# Patient Record
Sex: Female | Born: 1975 | Race: White | Hispanic: No | Smoking: Current every day smoker
Health system: Southern US, Community
[De-identification: ages and names within clinical notes are randomized; demographics above are authoritative.]

## PROBLEM LIST (undated history)

## (undated) DIAGNOSIS — F329 Major depressive disorder, single episode, unspecified: Secondary | ICD-10-CM

## (undated) DIAGNOSIS — IMO0002 Reserved for concepts with insufficient information to code with codable children: Secondary | ICD-10-CM

## (undated) DIAGNOSIS — M1990 Unspecified osteoarthritis, unspecified site: Secondary | ICD-10-CM

## (undated) DIAGNOSIS — F32A Depression, unspecified: Secondary | ICD-10-CM

## (undated) HISTORY — DX: Unspecified osteoarthritis, unspecified site: M19.90

## (undated) HISTORY — DX: Major depressive disorder, single episode, unspecified: F32.9

## (undated) HISTORY — DX: Depression, unspecified: F32.A

## (undated) HISTORY — DX: Reserved for concepts with insufficient information to code with codable children: IMO0002

---
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
## 2000-05-31 HISTORY — PX: DILATION AND CURETTAGE OF UTERUS: SHX78

## 2004-05-31 DIAGNOSIS — IMO0002 Reserved for concepts with insufficient information to code with codable children: Secondary | ICD-10-CM

## 2004-05-31 HISTORY — DX: Reserved for concepts with insufficient information to code with codable children: IMO0002

## 2011-09-12 ENCOUNTER — Emergency Department: Payer: Self-pay | Admitting: Emergency Medicine

## 2013-11-28 ENCOUNTER — Encounter: Payer: Self-pay | Admitting: Adult Health

## 2013-11-28 ENCOUNTER — Ambulatory Visit (INDEPENDENT_AMBULATORY_CARE_PROVIDER_SITE_OTHER): Payer: Managed Care, Other (non HMO) | Admitting: Adult Health

## 2013-11-28 ENCOUNTER — Encounter (INDEPENDENT_AMBULATORY_CARE_PROVIDER_SITE_OTHER): Payer: Self-pay

## 2013-11-28 VITALS — BP 96/60 | HR 90 | Temp 98.1°F | Resp 14 | Ht 62.25 in | Wt 113.5 lb

## 2013-11-28 DIAGNOSIS — Z7189 Other specified counseling: Secondary | ICD-10-CM

## 2013-11-28 DIAGNOSIS — Z7689 Persons encountering health services in other specified circumstances: Secondary | ICD-10-CM

## 2013-11-28 DIAGNOSIS — M4120 Other idiopathic scoliosis, site unspecified: Secondary | ICD-10-CM

## 2013-11-28 DIAGNOSIS — M419 Scoliosis, unspecified: Secondary | ICD-10-CM

## 2013-11-28 MED ORDER — CYCLOBENZAPRINE HCL 5 MG PO TABS: 5.0000 mg | ORAL_TABLET | Freq: Three times a day (TID) | ORAL | Status: DC | PRN

## 2013-11-28 NOTE — Patient Instructions (Signed)
   Thank you for choosing [facility] your [facility].  Please schedule your annual physical at your convenience.  You will need to be fasting so that we can check labs.  [facility]. Watch for those sharks!

## 2013-11-28 NOTE — Progress Notes (Signed)
Pre visit review using our clinic review tool, if applicable. No additional management support is needed unless otherwise documented below in the visit note. 

## 2013-11-28 NOTE — Progress Notes (Signed)
Patient ID: Emma Hicks, female   DOB: Jul 05, 1975, 38 y.o.   MRN: [id number]   Subjective:    Patient ID: Emma RaddleMelody Hocker, female    DOB: Jul 05, 1975, 38 y.o.   MRN: [id number]  HPI  Shamaya presents to clinic to establish care. She was mainly obtaining her health care from the [facility] secondary to not having any insurance. Followed at [facility] for her PAP. Overall she is feeling well. Hx of scoliosis s/p harrington rod placement. She follows with chiropractor for maintenance.    Past Medical History  Diagnosis Date  . Arthritis   . Depression   . Ulcer 2006     Past Surgical History  Procedure Laterality Date  . Herrington rods  1992    scoilosis     Family History  Problem Relation Age of Onset  . Asthma Mother   . Heart disease Mother   . Hypertension Mother   . Mental illness Mother   . Asthma Sister   . Hypertension Sister   . Aneurysm Sister 27    died  . Asthma Sister      History   Social History  . Marital Status: Divorced    Spouse Name: N/A    Number of Children: 1  . 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 Smoking status: Former Smoker -- 1.50 packs/day for 16 years  . Smokeless tobacco: Not on file  . Alcohol Use: Yes  . Drug Use: No  . Sexual Activity: Not on file   Other Topics Concern  . Not on file   Social History Narrative   [place] She is divorced. She had a baby girl in 1999 that she gave up for adoption at age 38. She also has had a miscarriage. She is currently living in [place]. 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 She is currently working at the [employer] in [place].    Review of Systems  Constitutional: Negative.   HENT: Negative.   Eyes: Negative.   Respiratory: Negative.   Cardiovascular: Negative.   Gastrointestinal:  Negative.   Endocrine: Negative.   Genitourinary: Negative.   Musculoskeletal:       Scoliosis s/p harrington rods  Skin: Negative.   Allergic/Immunologic: Negative.   Neurological: Negative.   Hematological: Negative.   Psychiatric/Behavioral: Negative.        Objective:  BP 96/60  Pulse 90  Temp(Src) 98.1 F (36.7 C) (Oral)  Resp 14  Ht 5' 2.25" (1.581 m)  Wt 113 lb 8 oz (51.483 kg)  BMI 20.60 kg/m2  SpO2 97%  LMP 11/12/2013   Physical Exam  Constitutional: She is oriented to person, place, and time. No distress.  HENT:  Head: Normocephalic and atraumatic.  Eyes: Conjunctivae and EOM are normal.  Neck: Normal range of motion. Neck supple.  Cardiovascular: Normal rate, regular rhythm, normal heart sounds and intact distal pulses.  Exam reveals no gallop and no friction rub.   No murmur heard. Pulmonary/Chest: Effort normal and breath sounds normal. No respiratory distress. She has no wheezes. She has no rales.  Musculoskeletal: Normal range of motion.  Neurological: She is alert and oriented to person, place, and time. She has normal reflexes. Coordination normal.  Skin: Skin is warm and dry.  Psychiatric: She has a normal mood and affect. Her behavior is normal. Judgment  and thought content normal.      Assessment & Plan:   1. Scoliosis She has harrington rods. No limitations. Occasionally takes muscle relaxer which seem to help her muscle tightness and spasms. Followed by chiropractor approximately every 2 months.  2. Encounter to establish care Review medical, surgical, family, social hx. Medications reviewed. Plan for her to return for her annual physical exam. Has PAPs at [facility].

## 2014-01-15 ENCOUNTER — Telehealth: Payer: Self-pay | Admitting: *Deleted

## 2014-01-15 NOTE — Telephone Encounter (Signed)
Pt is coming in tomorrow what labs and dx?  

## 2014-01-15 NOTE — Telephone Encounter (Signed)
Can you call her - I haven't ordered any. She is supposed to come in for her physical ???

## 2014-01-16 ENCOUNTER — Other Ambulatory Visit: Payer: Managed Care, Other (non HMO)

## 2014-01-16 NOTE — Patient Instructions (Signed)
Refer to phone note

## 2014-01-16 NOTE — Telephone Encounter (Signed)
Pt came in at 8am for labs.  She was upset that we did not have any orders.  She states she took 2 hours of personal time to come in for lab draw.  She further states you never mentioned she needed a PE.

## 2014-01-16 NOTE — Telephone Encounter (Signed)
What labs was she coming in for? Many patients that establish with me have been used to getting labs before coming to see their doctor. I do things a little different. I see the pt and then order labs.

## 2014-05-28 ENCOUNTER — Encounter: Payer: Self-pay | Admitting: Nurse Practitioner

## 2014-05-28 ENCOUNTER — Ambulatory Visit (INDEPENDENT_AMBULATORY_CARE_PROVIDER_SITE_OTHER): Payer: Managed Care, Other (non HMO) | Admitting: Nurse Practitioner

## 2014-05-28 VITALS — BP 100/62 | HR 82 | Temp 98.0°F | Resp 12 | Ht 63.0 in | Wt 117.8 lb

## 2014-05-28 DIAGNOSIS — F39 Unspecified mood [affective] disorder: Secondary | ICD-10-CM

## 2014-05-28 DIAGNOSIS — G43909 Migraine, unspecified, not intractable, without status migrainosus: Secondary | ICD-10-CM | POA: Insufficient documentation

## 2014-05-28 DIAGNOSIS — G43829 Menstrual migraine, not intractable, without status migrainosus: Secondary | ICD-10-CM

## 2014-05-28 DIAGNOSIS — F4310 Post-traumatic stress disorder, unspecified: Secondary | ICD-10-CM | POA: Insufficient documentation

## 2014-05-28 DIAGNOSIS — N943 Premenstrual tension syndrome: Secondary | ICD-10-CM

## 2014-05-28 DIAGNOSIS — R4586 Emotional lability: Secondary | ICD-10-CM | POA: Insufficient documentation

## 2014-05-28 MED ORDER — TOPIRAMATE 25 MG PO TABS: 25.0000 mg | ORAL_TABLET | Freq: Every day | ORAL | Status: DC

## 2014-05-28 NOTE — Progress Notes (Signed)
Pre visit review using our clinic review tool, if applicable. No additional management support is needed unless otherwise documented below in the visit note. 

## 2014-05-28 NOTE — Patient Instructions (Addendum)
Follow up in two months.   Call us if you need us in the mean time.   Happy New Year!

## 2014-05-28 NOTE — Progress Notes (Signed)
Subjective:    Patient ID: Emma Hicks, female    DOB: [birth year], [age] y.o.   MRN: [id number]  HPI  Emma Hicks is a 38 yo female here to meet her new provider.   1) [facility]- lab work and a Physical  2) Eye twitching- right eye, intermittent, started eye lid and is not less   3) PTSD- Counseling, medications, group therapy ect... Past substance abuse. Pt has hand shaking. Mood swings, irritable, hyperactive, not sleeping well, wake up feeling tired.  Paxil- in past  OTC sleep aids: feels groggy and less hungry for breakfast  Trazodone- unsure   Patient states she has [facility]   OTC medications: Mutlivitamin, iron, vitamin D.   4)Menstrual migraines in past. Headaches not as common now, still happening. Last one a few months ago. [facility]   Review of Systems  Constitutional: Negative for fever, chills, diaphoresis and fatigue.  Cardiovascular: Negative for chest pain, palpitations and leg swelling.  Gastrointestinal: Negative for nausea, vomiting and diarrhea.  Skin: Negative for rash.  Psychiatric/Behavioral: Positive for sleep disturbance and decreased concentration. Negative for suicidal ideas, hallucinations, behavioral problems, confusion, dysphoric mood and agitation. The patient is nervous/anxious and is hyperactive.        Irritability, also   Past Medical History  Diagnosis Date  . Arthritis   . Depression   . Ulcer 2006    History   Social History  . Marital Status: Divorced    Spouse Name: N/A    Number of Children: 1  . 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 Smoking status: Former Smoker -- 1.50 packs/day for 16 years  . Smokeless tobacco: Not on file  . Alcohol Use: Yes  . Drug Use: No  . Sexual Activity: Not on file   Other Topics Concern  . Not on file   Social History Narrative   [place]ma Hicks [place] She is divorced. She had a baby girl in 1999 that she gave up for adoption at age 38. She also has had a miscarriage. [place] living in [place]. 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 She is currently working at the [employer] in [place].    Past Surgical History  Procedure Laterality Date  . Herrington rods  1992    scoilosis    Family History  Problem Relation Age of Onset  . Asthma Mother   . Heart disease Mother   . Hypertension Mother   . Mental illness Mother   . Asthma Sister   . Hypertension Sister   . Aneurysm Sister 27    died  . Asthma Sister     Allergies  Allergen Reactions  . Oxycodone Itching  . Prednisone     Current Outpatient Prescriptions on File Prior to Visit  Medication Sig Dispense Refill  . cyclobenzaprine (FLEXERIL) 5 MG tablet Take 1 tablet (5 mg total) by mouth 3 (three) times daily as needed for muscle spasms. 30 tablet 3  . IRON PO Take 1 tablet by mouth daily.    . Multiple Vitamin (MULTIVITAMIN) tablet Take 1 tablet by mouth daily.     No current facility-administered medications on file prior to visit.      Objective:   Physical Exam  Constitutional: She is oriented to person, place,  and time. She appears well-developed and well-nourished. No distress.  HENT:  Head: Normocephalic and atraumatic.  Cardiovascular: Normal rate and regular rhythm.   Pulmonary/Chest: Effort normal and breath sounds normal.  Neurological: She is alert and oriented to person, place, and time.  Skin: Skin is warm and dry. No rash noted. She is not diaphoretic.  Psychiatric: Her speech is normal and behavior is normal. Judgment and thought content normal. Her mood appears anxious. Her affect is not angry, not blunt, not labile and not inappropriate. Thought content is not paranoid and not delusional. Cognition and memory are not impaired. She does not express  impulsivity or inappropriate judgment. She does not exhibit a depressed mood. She expresses no homicidal and no suicidal ideation. She expresses no suicidal plans and no homicidal plans. She exhibits normal recent memory and normal remote memory.   BP 100/62 mmHg  Pulse 82  Temp(Src) 98 F (36.7 C) (Oral)  Resp 12  Ht 5\' 3"  (1.6 m)  Wt 117 lb 12.8 oz (53.434 kg)  BMI 20.87 kg/m2  SpO2 99%    Assessment & Plan:

## 2014-05-28 NOTE — Assessment & Plan Note (Signed)
Stable. OCP helpful, still having some occasionally. Rx for topiramate 25 mg daily.

## 2014-05-28 NOTE — Assessment & Plan Note (Signed)
Obtain records from [facility]. Pt stable. Screened for Bipolar disorder. Unable to make diagnosis at this time, but it is suspected. FU in 2/29/15

## 2014-07-29 ENCOUNTER — Encounter: Payer: Self-pay | Admitting: Nurse Practitioner

## 2014-07-29 ENCOUNTER — Ambulatory Visit (INDEPENDENT_AMBULATORY_CARE_PROVIDER_SITE_OTHER): Payer: Managed Care, Other (non HMO) | Admitting: Nurse Practitioner

## 2014-07-29 VITALS — BP 110/74 | HR 96 | Resp 14 | Ht 63.0 in | Wt 115.2 lb

## 2014-07-29 DIAGNOSIS — N943 Premenstrual tension syndrome: Secondary | ICD-10-CM

## 2014-07-29 DIAGNOSIS — G43829 Menstrual migraine, not intractable, without status migrainosus: Secondary | ICD-10-CM

## 2014-07-29 DIAGNOSIS — Z8739 Personal history of other diseases of the musculoskeletal system and connective tissue: Secondary | ICD-10-CM

## 2014-07-29 MED ORDER — CELECOXIB 200 MG PO CAPS: 200.0000 mg | ORAL_CAPSULE | Freq: Two times a day (BID) | ORAL | Status: DC

## 2014-07-29 MED ORDER — TOPIRAMATE 25 MG PO TABS: 25.0000 mg | ORAL_TABLET | Freq: Two times a day (BID) | ORAL | Status: DC

## 2014-07-29 NOTE — Patient Instructions (Addendum)
Try the Topamax 2 tablets. Take 1 if too fuzzy headed.   Try 2 of the Celebrex. FU in 1 month.

## 2014-07-29 NOTE — Progress Notes (Signed)
   Subjective:    Patient ID: Emma Hicks, female    DOB: [birth year], 39 y.o.   MRN: [id number]  HPI  Emma Hicks is a [age] female following up on migraines.   1) Not taking topamax on a daily basis, no changes, same amount of headaches  2) Pt took Celebrex in past with relief for bursitis in bilateral hips and wrist pain.    Review of Systems  Respiratory: Negative for chest tightness, shortness of breath and wheezing.   Cardiovascular: Negative for chest pain, palpitations and leg swelling.  Gastrointestinal: Negative for nausea, vomiting and diarrhea.  Neurological: Positive for headaches. Negative for dizziness, syncope, weakness and numbness.       Objective:   Physical Exam  Constitutional: She is oriented to person, place, and time. She appears well-developed and well-nourished. No distress.  BP 110/74 mmHg  Pulse 96  Resp 14  Ht 5\' 3"  (1.6 m)  Wt 115 lb 4 oz (52.277 kg)  BMI 20.42 kg/m2  SpO2 99%   HENT:  Head: Normocephalic and atraumatic.  Right Ear: External ear normal.  Left Ear: External ear normal.  Cardiovascular: Normal rate, regular rhythm, normal heart sounds and intact distal pulses.  Exam reveals no gallop and no friction rub.   No murmur heard. Pulmonary/Chest: Effort normal and breath sounds normal. No respiratory distress. She has no wheezes. She has no rales. She exhibits no tenderness.  Musculoskeletal: She exhibits tenderness.  Bilateral hips have localized tenderness laterally and with ROM  Neurological: She is alert and oriented to person, place, and time. No cranial nerve deficit. She exhibits normal muscle tone. Coordination normal.  Skin: Skin is warm and dry. No rash noted. She is not diaphoretic.  Psychiatric: She has a normal mood and affect. Her behavior is normal. Judgment and thought content normal.      Assessment & Plan:

## 2014-07-29 NOTE — Progress Notes (Signed)
Pre visit review using our clinic review tool, if applicable. No additional management support is needed unless otherwise documented below in the visit note. 

## 2014-08-03 DIAGNOSIS — Z8739 Personal history of other diseases of the musculoskeletal system and connective tissue: Secondary | ICD-10-CM | POA: Insufficient documentation

## 2014-08-03 NOTE — Assessment & Plan Note (Addendum)
Bursitis is worsening again. Celebrex 200 mg twice daily prn for pain. Instructed pt to take something like zantac OTC to protect her stomach.

## 2014-08-03 NOTE — Assessment & Plan Note (Signed)
Changed topamax to twice daily. Stressed importance of taking medication as prescribed, otherwise it will not be helpful. She then discussed taking her friend's Soma and asked about this. I told her our policy for prescribing controlled substances and she was no longer interested. Will follow.

## 2014-08-27 ENCOUNTER — Ambulatory Visit: Payer: Managed Care, Other (non HMO) | Admitting: Nurse Practitioner

## 2014-09-09 ENCOUNTER — Ambulatory Visit: Payer: Managed Care, Other (non HMO) | Admitting: Nurse Practitioner

## 2014-11-03 ENCOUNTER — Other Ambulatory Visit: Payer: Self-pay | Admitting: Nurse Practitioner

## 2014-11-04 NOTE — Telephone Encounter (Signed)
Last visit 07/29/14, refill?

## 2014-12-03 ENCOUNTER — Ambulatory Visit: Payer: Managed Care, Other (non HMO) | Admitting: Nurse Practitioner

## 2014-12-05 ENCOUNTER — Ambulatory Visit (INDEPENDENT_AMBULATORY_CARE_PROVIDER_SITE_OTHER): Payer: Managed Care, Other (non HMO) | Admitting: Nurse Practitioner

## 2014-12-05 VITALS — BP 102/68 | HR 91 | Temp 98.1°F | Resp 16 | Ht 63.0 in | Wt 114.8 lb

## 2014-12-05 DIAGNOSIS — R21 Rash and other nonspecific skin eruption: Secondary | ICD-10-CM

## 2014-12-05 MED ORDER — TRIAMCINOLONE ACETONIDE 0.025 % EX OINT: 1.0000 "application " | TOPICAL_OINTMENT | Freq: Two times a day (BID) | CUTANEOUS | Status: DC

## 2014-12-05 MED ORDER — RANITIDINE HCL 150 MG PO TABS: 150.0000 mg | ORAL_TABLET | Freq: Two times a day (BID) | ORAL | Status: DC

## 2014-12-05 MED ORDER — CETIRIZINE HCL 10 MG PO TABS: 10.0000 mg | ORAL_TABLET | Freq: Every day | ORAL | Status: DC

## 2014-12-05 MED ORDER — CELECOXIB 200 MG PO CAPS: 200.0000 mg | ORAL_CAPSULE | Freq: Two times a day (BID) | ORAL | Status: DC

## 2014-12-05 NOTE — Patient Instructions (Signed)

## 2014-12-05 NOTE — Progress Notes (Signed)
   Subjective:    Patient ID: Emma Hicks, female    DOB: [birth year], 39 y.o.   MRN: [id number]  HPI  Emma Hicks is a 39 yo female with a CC of rash.   1) Neck to shoulders and abdomen  Fastmed- prednisone, ranitidine, and certrizine  Benadryl- not helpful  Small ticks recently seen on pt- no attachment or engorgement seen by pt.   Changed her body wash and detergent recently, but switched back recently. Last day prednisone broke out again.   Review of Systems  Constitutional: Negative for fever, chills, diaphoresis and fatigue.  Respiratory: Negative for chest tightness, shortness of breath and wheezing.   Cardiovascular: Negative for chest pain, palpitations and leg swelling.  Gastrointestinal: Negative for nausea and vomiting.  Skin: Positive for rash.  Neurological: Negative for dizziness, weakness, numbness and headaches.  Psychiatric/Behavioral: The patient is not nervous/anxious.       Objective:   Physical Exam  Constitutional: She is oriented to person, place, and time. She appears well-developed and well-nourished. No distress.  BP 102/68 mmHg  Pulse 91  Temp(Src) 98.1 F (36.7 C)  Resp 16  Ht 5\' 3"  (1.6 m)  Wt 114 lb 12.8 oz (52.073 kg)  BMI 20.34 kg/m2  SpO2 98%   HENT:  Head: Normocephalic and atraumatic.  Right Ear: External ear normal.  Left Ear: External ear normal.  Neurological: She is alert and oriented to person, place, and time. No cranial nerve deficit. She exhibits normal muscle tone. Coordination normal.  Skin: Skin is warm and dry. No rash noted. She is not diaphoretic.  Patches of red, dry skin, no significant findings  Psychiatric: She has a normal mood and affect. Her behavior is normal. Judgment and thought content normal.      Assessment & Plan:

## 2014-12-18 ENCOUNTER — Encounter: Payer: Self-pay | Admitting: Nurse Practitioner

## 2014-12-18 DIAGNOSIS — R21 Rash and other nonspecific skin eruption: Secondary | ICD-10-CM | POA: Insufficient documentation

## 2014-12-18 NOTE — Assessment & Plan Note (Signed)
Will try kenalog ointment since other treatments (cetrizine, ranitidine, prednisone) were not helpful. Will send to dermatology if not helpful. FU prn worsening/failure to improve.

## 2015-02-18 ENCOUNTER — Other Ambulatory Visit: Payer: Self-pay | Admitting: *Deleted

## 2015-02-18 MED ORDER — CELECOXIB 200 MG PO CAPS: 200.0000 mg | ORAL_CAPSULE | Freq: Two times a day (BID) | ORAL | Status: DC

## 2015-03-14 ENCOUNTER — Other Ambulatory Visit: Payer: Self-pay | Admitting: Nurse Practitioner

## 2015-03-14 NOTE — Telephone Encounter (Signed)
Patient coming for appointment on 03/20/15

## 2015-03-14 NOTE — Telephone Encounter (Signed)
Needs to be seen for medication change.

## 2015-03-14 NOTE — Telephone Encounter (Signed)
Please advise in Carrie's Absence today?

## 2015-03-14 NOTE — Telephone Encounter (Signed)
Pt called about the medication that she's taking for her arithitis and burcitis it's not working. Medication is celecoxib (CELEBREX) 200 MG capsule. Pt would like to know what else can be prescribed. Pt heard of a medication called Relafen. Pharmacy is [facility] on [address]. Thank You!

## 2015-03-14 NOTE — Telephone Encounter (Signed)
Asher MuirJamie, can you call this patient and schedule an appointment with C.Doss

## 2015-03-17 NOTE — Telephone Encounter (Signed)
Thanks

## 2015-03-20 ENCOUNTER — Ambulatory Visit: Payer: Managed Care, Other (non HMO) | Admitting: Nurse Practitioner

## 2015-04-02 ENCOUNTER — Other Ambulatory Visit: Payer: Self-pay | Admitting: Orthopedic Surgery

## 2015-04-02 DIAGNOSIS — M24852 Other specific joint derangements of left hip, not elsewhere classified: Secondary | ICD-10-CM

## 2015-04-17 ENCOUNTER — Ambulatory Visit: Payer: Managed Care, Other (non HMO)

## 2015-05-05 ENCOUNTER — Ambulatory Visit
Admission: RE | Admit: 2015-05-05 | Discharge: 2015-05-05 | Disposition: A | Discharge status: Home / Self Care | Payer: Managed Care, Other (non HMO) | Source: Ambulatory Visit | Attending: Orthopedic Surgery | Admitting: Orthopedic Surgery

## 2015-05-05 ENCOUNTER — Ambulatory Visit: Payer: Managed Care, Other (non HMO)

## 2015-05-05 DIAGNOSIS — M25552 Pain in left hip: Secondary | ICD-10-CM | POA: Diagnosis not present

## 2015-05-05 DIAGNOSIS — M419 Scoliosis, unspecified: Secondary | ICD-10-CM | POA: Insufficient documentation

## 2015-05-05 DIAGNOSIS — M24852 Other specific joint derangements of left hip, not elsewhere classified: Secondary | ICD-10-CM

## 2015-05-05 DIAGNOSIS — M25452 Effusion, left hip: Secondary | ICD-10-CM | POA: Insufficient documentation

## 2015-05-05 MED ORDER — GADOBENATE DIMEGLUMINE 529 MG/ML IV SOLN: 0.0500 mL | Freq: Once | INTRAVENOUS | Status: DC | PRN

## 2015-05-05 MED ORDER — IOHEXOL 180 MG/ML  SOLN: 15.0000 mL | Freq: Once | INTRAMUSCULAR | Status: DC | PRN

## 2016-03-17 ENCOUNTER — Ambulatory Visit: Payer: Worker's Compensation | Attending: Family Medicine | Admitting: Occupational Therapy

## 2016-03-17 DIAGNOSIS — M79601 Pain in right arm: Secondary | ICD-10-CM | POA: Diagnosis present

## 2016-03-17 DIAGNOSIS — M6281 Muscle weakness (generalized): Secondary | ICD-10-CM | POA: Diagnosis present

## 2016-03-17 DIAGNOSIS — R208 Other disturbances of skin sensation: Secondary | ICD-10-CM | POA: Diagnosis present

## 2016-03-17 NOTE — Therapy (Signed)
[facility] [address] [place], [address] Phone: [phone]   Fax:  [phone]  Occupational Therapy Evaluation  Patient Details  Name: Emma Hicks MRN: [id number] Date of Birth: 03/18/1976 Referring Provider: Harrie Jeans  Encounter Date: 03/17/2016      OT End of Session - 03/17/16 2032    Visit Number 1   Number of Visits 4   Date for OT Re-Evaluation 04/14/16   OT Start Time 1338   OT Stop Time 1430   OT Time Calculation (min) 52 min   Activity Tolerance Patient tolerated treatment well   Behavior During Therapy [facility] for tasks assessed/performed      Past Medical History:  Diagnosis Date  . Arthritis   . Depression   . Ulcer 2006    Past Surgical History:  Procedure Laterality Date  . 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      Subjective Assessment - 03/17/16 1947    Subjective  I was hurt at [facility] where  I was working on Nov - seen PA Dedra Skeens - for lateral epicondylitis and CTS - had therapy for few wks but could  not afford it , because was not workers comp  - now was seen by Dr Harrie Jeans and is now workers comp - he refer me to theapy again    Patient Stated Goals Want to get the numbness and pain better at my R elbow and hand - don't want to drop things, pick up or hold things , go back to work , help with taking care of animal   Currently in Pain? Yes   Pain Score 4    Pain Location Arm   Pain Orientation Right   Pain Descriptors / Indicators Aching;Numbness;Pins and needles   Pain Type Chronic pain           [facility] OT Assessment - 03/17/16 0001      Assessment   Diagnosis R wrist tendinitis    Referring Provider Neidecker   Onset Date 04/01/15   Prior Therapy --  Had PT from 33/3/16 to 06/05/15     Home  Environment   Lives With Friend(s)     Prior Function   Leisure R hand dominant - looking for new job, likes to  read, movies, walk, workout, play some on phone or laptop, take care of animal     AROM   Overall AROM Comments R and L hand AROM for  digits and wrist , elbow WNL  but pain at  end range if pt force it and tight fist      Strength   Overall Strength Comments Strength in wrist in all planes 5/5  , no pain    Right Hand Grip (lbs) 42   Right Hand Lateral Pinch 15 lbs   Right Hand 3 Point Pinch 15 lbs   Left Hand Grip (lbs) 54   Left Hand Lateral Pinch 11 lbs   Left Hand 3 Point Pinch 11 lbs     HEP review with pt   R wrist - wrist splint for night time Med N glides 5 reps Tendon glides 10 reps Contrast prior   Avoid tight grip in combination with wrist flexion  And lat grip with wrist flexion   For elbow heat , massage and extended arm with PROM flexion stretch with wrist In neutral and in pronation  2 x  day                      OT Education - 03/17/16 2031    Education provided Yes   Education Details HEP and findings    Person(s) Educated Patient   Methods Explanation;Demonstration;Tactile cues;Verbal cues;Handout   Comprehension Verbal cues required;Returned demonstration;Verbalized understanding          OT Short Term Goals - 03/17/16 2044      OT SHORT TERM GOAL #1   Title Pain and numbness in R hand improve with at least 20 points on PRWHE    Baseline At eval score 36/50   Time 3   Period Weeks   Status New           OT Long Term Goals - 03/17/16 2047      OT LONG TERM GOAL #1   Title Grip strength in R hand improve with 10 lbs to not drop objects , and do hair, carry dog food   Baseline grip R 42; L 54 lbs    Time 4   Period Weeks   Status New     OT LONG TERM GOAL #2   Title Pt to be in  HEP to decrease pain at elbow and numbness/pain in R hand/wrist    Baseline very little knowledge    Time 4   Period Weeks   Status New               Plan - 03/17/16 2034    Clinical Impression Statement Pt present at eval with  diagnosis of R wrist tendinitis - pt has history of DeQuervains and still positive Finkelstein - but lateral and 3 point grip WNL - AROM in all joints on R elbow to hand WNL - and strength in wrist  in all planes 5/5 with no pain - pt  has pain in end range wrist  AROM ,digits flexion and extention  when force - pt  is one that is "hard handed" - pt to light up on ROM and grasping object - pt negative for Tinel at cubital tunnel,  but some  tenderness -  negative Tinel  and Phalens for CTS - but report numbness in R hand more than L - whole  R hand - pt do have some tenderness  and pain at elbow and lateral epicondyle - tightness with stretch of wrist extensors - pt do have history of  tightenss in upper back , shoulder protraction with scapula winging , this date pain and limitied ROM  in cervical in all planes - pt was provided with HEP for CTS and lateral epicondylitis - pt to return in week    Rehab Potential Fair   Clinical Impairments Affecting Rehab Potential ? cervical involvement ; recommend Nerve conduction test    OT Frequency 1x / week   OT Duration 4 weeks   OT Treatment/Interventions Splinting;Patient/family education;Therapeutic exercises;Contrast Bath;Passive range of motion;Manual Therapy   Plan appt with MD ? , and progress with HEP   OT Home Exercise Plan see pt instruction   Consulted and Agree with Plan of Care Patient      Patient will benefit from skilled therapeutic intervention in order to improve the following deficits and impairments:  Impaired flexibility, Impaired sensation, Pain, Impaired UE functional use  Visit Diagnosis: Other disturbances of skin sensation - Plan: Ot plan of care cert/re-cert  Pain in right arm - Plan: Ot plan of care cert/re-cert  Muscle  weakness (generalized) - Plan: Ot plan of care cert/re-cert    Problem List Patient Active Problem List   Diagnosis Date Noted  . Rash and nonspecific skin eruption 12/18/2014  . History of bursitis  08/03/2014  . Post traumatic stress disorder (PTSD) 05/28/2014  . Mood swings (HCC) 05/28/2014  . Migraines 05/28/2014    Oletta Cohn OTR/L,CLT 03/17/2016, 8:54 PM  [facility] [address] [place], [address] Phone: [phone]   Fax:  [phone]  Name: Emma Hicks MRN: [id number] Date of Birth: 20-Dec-1975

## 2016-03-17 NOTE — Patient Instructions (Signed)
R wrist - wrist splint for night time Med N glides 5 reps Tendon glides 10 reps Contrast prior   Avoid tight grip in combination with wrist flexion  And lat grip with wrist flexion   For elbow heat , massage and extended arm with PROM flexion stretch with wrist In neutral and in pronation  2 x day

## 2016-03-26 ENCOUNTER — Ambulatory Visit: Payer: Worker's Compensation | Attending: Family Medicine | Admitting: Occupational Therapy

## 2016-03-26 DIAGNOSIS — M6281 Muscle weakness (generalized): Secondary | ICD-10-CM | POA: Diagnosis present

## 2016-03-26 DIAGNOSIS — R208 Other disturbances of skin sensation: Secondary | ICD-10-CM | POA: Diagnosis present

## 2016-03-26 DIAGNOSIS — M79601 Pain in right arm: Secondary | ICD-10-CM | POA: Diagnosis present

## 2016-03-26 NOTE — Therapy (Signed)
[facility] PHYSICAL AND SPORTS MEDICINE [address] [place], [place], [address] Phone: [phone]   Fax:  [phone]  Occupational Therapy Treatment  Patient Details  Name: Emma Hicks MRN: [id number] Date of Birth: [birth year] Referring Provider: Harrie JeansNeidecker  Encounter Date: 03/26/2016      OT End of Session - 03/26/16 1031    Visit Number 2   Number of Visits 4   Date for OT Re-Evaluation 04/14/16   OT Start Time 0938   OT Stop Time 1016   OT Time Calculation (min) 38 min   Activity Tolerance Patient tolerated treatment well      Past Medical History:  Diagnosis Date  . Arthritis   . Depression   . Ulcer 2006    Past Surgical History:  Procedure Laterality Date  . 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      Subjective Assessment - 03/26/16 1025    Subjective  Doing okay - I did not get time to do all the exercises - and maybe wore the splint about 25% of time - I got new job at [employer] - cannot wear splint - but elbow still hurting and  tight - my hand also hurting    Patient Stated Goals Want to get the numbness and pain better at my R elbow and hand - don't want to drop things, pick up or hold things , go back to work , help with taking care of animal   Pain Score 6    Pain Location Elbow   Pain Orientation Right   Pain Descriptors / Indicators Aching      After paraffin and heat  Soft tissue mobs done to volar elbow - proximal and distal - as well as volar wrist and palm - over CT  Pt with some tightness at lateral epicondyle and volar aspect of arm - responded good on  graston tools 2 more than 4 - using sweeping and scooping  with follow PROM composite extention of wrist and digits  And  Extended elbow with wrist flexion - open hand  5 reps each   HEP review with pt again   R wrist - wrist splint for night time Med N glides 5 reps Tendon glides 10 reps Needed mod  A and mod v/c for doing correctly and not forcing it   Ed on Avoid tight grip in combination with wrist flexion  And lat grip with wrist flexion  And then for elbow to cont with heat - massage - and extended arm - with wrist flexion stretch - open hand - in pronation and neutral                     OT Treatments/Exercises (OP) - 03/26/16 0001      Moist Heat Therapy   Number Minutes Moist Heat 10 Minutes   Moist Heat Location Elbow     RUE Paraffin   Number Minutes Paraffin 10 Minutes   RUE Paraffin Location Hand   [facility] and manual therapy to elbow - to decrease pain                 OT Education - 03/26/16 1030    Education provided Yes   Education Details HEP review   Person(s) Educated Patient   Methods Explanation;Demonstration;Tactile cues          OT Short Term Goals -  03/17/16 2044      OT SHORT TERM GOAL #1   Title Pain and numbness in R hand improve with at least 20 points on PRWHE    Baseline At eval score 36/50   Time 3   Period Weeks   Status New           OT Long Term Goals - 03/17/16 2047      OT LONG TERM GOAL #1   Title Grip strength in R hand improve with 10 lbs to not drop objects , and do hair, carry dog food   Baseline grip R 42; L 54 lbs    Time 4   Period Weeks   Status New     OT LONG TERM GOAL #2   Title Pt to be in  HEP to decrease pain at elbow and numbness/pain in R hand/wrist    Baseline very little knowledge    Time 4   Period Weeks   Status New               Plan - 03/26/16 1031    Clinical Impression Statement Pt present with some tightness on volar elbow, forearm and CT - pt responded good on graston tools and stretches - turning red over CT - and tight - reviewed again with pt HEP and what act to avoid - pt to cont with HEP for week    Rehab Potential Fair   Clinical Impairments Affecting Rehab Potential ? cervical involvement    OT Frequency 1x / week   OT Duration  4 weeks   OT Treatment/Interventions Splinting;Patient/family education;Therapeutic exercises;Contrast Bath;Passive range of motion;Manual Therapy   Plan assess progress with HEP   OT Home Exercise Plan see pt instruction   Consulted and Agree with Plan of Care Patient      Patient will benefit from skilled therapeutic intervention in order to improve the following deficits and impairments:  Impaired flexibility, Impaired sensation, Pain, Impaired UE functional use  Visit Diagnosis: Other disturbances of skin sensation  Pain in right arm  Muscle weakness (generalized)    Problem List Patient Active Problem List   Diagnosis Date Noted  . Rash and nonspecific skin eruption 12/18/2014  . History of bursitis 08/03/2014  . Post traumatic stress disorder (PTSD) 05/28/2014  . Mood swings (HCC) 05/28/2014  . Migraines 05/28/2014    Oletta Cohn OTR/L,CLT 03/26/2016, 10:47 AM  [place] [facility] PHYSICAL AND SPORTS MEDICINE [address], [place], [address] Phone: [phone]   Fax:  [phone]  Name: Emma Hicks MRN: [id number] Date of Birth: [birth year]

## 2016-03-26 NOTE — Patient Instructions (Addendum)
  HEP review with pt again   R wrist - wrist splint for night time Med N glides 5 reps Tendon glides 10 reps Needed mod A and mod v/c for doing correctly and not forcing it   Ed on Avoid tight grip in combination with wrist flexion  And lat grip with wrist flexion  And then for elbow to cont with heat - massage - and extended arm - with wrist flexion stretch - open hand - in pronation and neutral

## 2016-04-02 ENCOUNTER — Ambulatory Visit: Payer: Worker's Compensation | Admitting: Occupational Therapy

## 2016-04-07 ENCOUNTER — Ambulatory Visit: Payer: Worker's Compensation | Attending: Family Medicine | Admitting: Occupational Therapy

## 2016-04-07 DIAGNOSIS — M6281 Muscle weakness (generalized): Secondary | ICD-10-CM | POA: Insufficient documentation

## 2016-04-07 DIAGNOSIS — R208 Other disturbances of skin sensation: Secondary | ICD-10-CM | POA: Insufficient documentation

## 2016-04-07 DIAGNOSIS — M79601 Pain in right arm: Secondary | ICD-10-CM | POA: Insufficient documentation

## 2016-04-07 NOTE — Patient Instructions (Addendum)
Extended elbow with wrist flexion - open hand  5 reps each  R wrist - wrist splint for night time Med N glides 5 reps Tendon glides 10 reps Can do some massage in webspace of thumb and over forearm   Ed on Avoid tight grip in combination with wrist flexion  And lat grip with wrist flexion  And then for elbow to cont with heat - massage - and extended arm - with wrist flexion stretch - open hand - in pronation and neutral

## 2016-04-07 NOTE — Therapy (Signed)
Rash and nonspecific skin eruption 12/18/2014  .  History of bursitis 08/03/2014  . Post traumatic stress disorder (PTSD) 05/28/2014  . Mood swings (HCC) 05/28/2014  . Migraines 05/28/2014    Oletta CohnuPreez, Wesson Stith OTR/L,CLT 04/07/2016, 2:52 PM   [facility] PHYSICAL [facility]TS MEDICINE [address] , [place], [address] Phone: [phone]   Fax:  [phone]  Name: Presley RaddleMelody Arkin MRN: [id number] Date of Birth: 16-May-1976  [place] PHYS[facility] SPORTS MEDICINE [address] [place], [place], [address] Phone: [phone]   Fax:  [phone]  Occupational Therapy Treatment  Patient Details  Name: Presley RaddleMelody Livermore MRN: [id number] Date of Birth: November 10, 1975 Referring Provider: Harrie JeansNeidecker  Encounter Date: 04/07/2016      OT End of Session - 04/07/16 0827    Visit Number 3   Number of Visits 4   Date for OT Re-Evaluation 04/14/16   OT Start Time 0807   OT Stop Time 0846   OT Time Calculation (min) 39 min   Activity Tolerance Patient tolerated treatment well   Behavior During Therapy [facility] for tasks assessed/performed      Past Medical History:  Diagnosis Date  . Arthritis   . Depression   . Ulcer 2006    Past Surgical History:  Procedure Laterality Date  . 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      Subjective Assessment - 04/07/16 0824    Subjective  My fingers and hand just stiff and ache - the more I use it - and then have to shake it because it cramp - numbness more in am - trying to sleep with my elbow straight - elbow feels better than the hand    Patient Stated Goals Want to get the numbness and pain better at my R elbow and hand - don't want to drop things, pick up or hold things , go back to work , help with taking care of animal   Currently in Pain? Yes   Pain Score 7    Pain Location Hand   Pain Orientation Right   Pain Descriptors / Indicators Aching;Cramping            [facility] OT Assessment - 04/07/16 0001      Strength   Right Hand Grip (lbs) 24   Right Hand Lateral Pinch 8 lbs   Right Hand 3 Point Pinch 12 lbs   Left Hand Grip (lbs) 25   Left Hand Lateral Pinch 10 lbs   Left Hand 3 Point Pinch 10 lbs      Assess pain - and ROM - extention 3rd digit and wrist extention  With resistance no pain Forearm extensor stretches - felt more at wrist and over extensor mass Positive test for  Phalens test Tenderness over CT Neg for Cubital tunnel and medial epicondyle  Pt report more pain this date over hand and with making fist - with some sensory changes  After paraffin and heat  Soft tissue mobs done to volar forearm - proximal and distal - as well as volar wrist and palm - over CT  Pt with some tightness at lateral epicondyle and volar aspect of fore arm - responded good on  graston tools 2 more than 4 - using sweeping and scooping  with follow PROM composite extention of wrist and digits  Also done soft tissue work and trigger point in webspace of thumb - pain decrease afterwards   Grip and prehension were decrease this date compare to eval - and increase 10 lbs on R after paraffin Reviewed HEP again with pt   Extended elbow with wrist flexion - open hand  5 reps each  R wrist - wrist splint for night time Med N glides 5 reps Tendon glides 10 reps Can do some massage in webspace of thumb and over forearm   Ed on Avoid tight grip  [place] [facility] PHYSICAL [facility] SP[facility]TS MEDICINE [address] [place], [place], [address] Phone: [phone]   Fax:  [phone]  Occupational Therapy Treatment  Patient Details  Name: Presley RaddleMelody Livermore MRN: [id number] Date of Birth: November 10, 1975 Referring Provider: Harrie JeansNeidecker  Encounter Date: 04/07/2016      OT End of Session - 04/07/16 0827    Visit Number 3   Number of Visits 4   Date for OT Re-Evaluation 04/14/16   OT Start Time 0807   OT Stop Time 0846   OT Time Calculation (min) 39 min   Activity Tolerance Patient tolerated treatment well   Behavior During Therapy [facility] for tasks assessed/performed      Past Medical History:  Diagnosis Date  . Arthritis   . Depression   . Ulcer 2006    Past Surgical History:  Procedure Laterality Date  . 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      Subjective Assessment - 04/07/16 0824    Subjective  My fingers and hand just stiff and ache - the more I use it - and then have to shake it because it cramp - numbness more in am - trying to sleep with my elbow straight - elbow feels better than the hand    Patient Stated Goals Want to get the numbness and pain better at my R elbow and hand - don't want to drop things, pick up or hold things , go back to work , help with taking care of animal   Currently in Pain? Yes   Pain Score 7    Pain Location Hand   Pain Orientation Right   Pain Descriptors / Indicators Aching;Cramping            [facility] OT Assessment - 04/07/16 0001      Strength   Right Hand Grip (lbs) 24   Right Hand Lateral Pinch 8 lbs   Right Hand 3 Point Pinch 12 lbs   Left Hand Grip (lbs) 25   Left Hand Lateral Pinch 10 lbs   Left Hand 3 Point Pinch 10 lbs      Assess pain - and ROM - extention 3rd digit and wrist extention  With resistance no pain Forearm extensor stretches - felt more at wrist and over extensor mass Positive test for  Phalens test Tenderness over CT Neg for Cubital tunnel and medial epicondyle  Pt report more pain this date over hand and with making fist - with some sensory changes  After paraffin and heat  Soft tissue mobs done to volar forearm - proximal and distal - as well as volar wrist and palm - over CT  Pt with some tightness at lateral epicondyle and volar aspect of fore arm - responded good on  graston tools 2 more than 4 - using sweeping and scooping  with follow PROM composite extention of wrist and digits  Also done soft tissue work and trigger point in webspace of thumb - pain decrease afterwards   Grip and prehension were decrease this date compare to eval - and increase 10 lbs on R after paraffin Reviewed HEP again with pt   Extended elbow with wrist flexion - open hand  5 reps each  R wrist - wrist splint for night time Med N glides 5 reps Tendon glides 10 reps Can do some massage in webspace of thumb and over forearm   Ed on Avoid tight grip

## 2016-04-14 ENCOUNTER — Ambulatory Visit: Payer: Worker's Compensation | Admitting: Occupational Therapy

## 2016-04-16 ENCOUNTER — Ambulatory Visit: Payer: Worker's Compensation | Admitting: Occupational Therapy

## 2016-04-20 ENCOUNTER — Ambulatory Visit: Payer: Worker's Compensation | Admitting: Occupational Therapy

## 2016-05-04 ENCOUNTER — Ambulatory Visit: Payer: Worker's Compensation | Attending: Family Medicine | Admitting: Occupational Therapy

## 2016-05-04 DIAGNOSIS — M6281 Muscle weakness (generalized): Secondary | ICD-10-CM | POA: Insufficient documentation

## 2016-05-04 DIAGNOSIS — M79601 Pain in right arm: Secondary | ICD-10-CM | POA: Insufficient documentation

## 2016-05-04 DIAGNOSIS — R208 Other disturbances of skin sensation: Secondary | ICD-10-CM | POA: Insufficient documentation

## 2016-05-12 ENCOUNTER — Ambulatory Visit: Payer: Worker's Compensation | Admitting: Occupational Therapy

## 2016-05-12 DIAGNOSIS — M79601 Pain in right arm: Secondary | ICD-10-CM | POA: Diagnosis present

## 2016-05-12 DIAGNOSIS — R208 Other disturbances of skin sensation: Secondary | ICD-10-CM | POA: Diagnosis not present

## 2016-05-12 DIAGNOSIS — M6281 Muscle weakness (generalized): Secondary | ICD-10-CM | POA: Diagnosis present

## 2016-05-12 NOTE — Therapy (Signed)
[facility] SPORTS MEDICINE [address] [place], [address] Phone: [phone]   Fax:  [phone]  Occupational Therapy Treatment  Patient Details  Name: Emma Hicks MRN: [id number] Date of Birth: Apr 30, 1976 Referring Provider: Harrie Jeans  Encounter Date: 05/12/2016      OT End of Session - 05/12/16 1247    Visit Number 4   Number of Visits 8   Date for OT Re-Evaluation 06/09/16   OT Start Time 0825   OT Stop Time 0903   OT Time Calculation (min) 38 min   Activity Tolerance Patient tolerated treatment well   Behavior During Therapy [facility] for tasks assessed/performed      Past Medical History:  Diagnosis Date  . Arthritis   . Depression   . Ulcer 2006    Past Surgical History:  Procedure Laterality Date  . 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      Subjective Assessment - 05/12/16 0827    Subjective  Seen the MD - and going to see again after nerve conduction - and that was done yesterday - they said normal- Working 5 days a week - still hurting - forearm hurts and elbow - hand goes still numb about once day - and hand hurts    Patient Stated Goals Want to get the numbness and pain better at my R elbow and hand - don't want to drop things, pick up or hold things , go back to work , help with taking care of animal   Currently in Pain? Yes   Pain Score 7    Pain Location Elbow   Pain Descriptors / Indicators Aching            [facility] OT Assessment - 05/12/16 0001      Strength   Right Hand Grip (lbs) 45   Right Hand Lateral Pinch 12 lbs   Right Hand 3 Point Pinch 10 lbs   Left Hand Grip (lbs) 39   Left Hand Lateral Pinch 10 lbs   Left Hand 3 Point Pinch 8 lbs                  OT Treatments/Exercises (OP) - 05/12/16 0001      RUE Paraffin   Number Minutes Paraffin 10 Minutes   RUE Paraffin Location Hand   Comments AT SOC to decreaes pain  and tightness in palm and wrist - heaintpad over hand and forearm to decrease pain       measured grip and prehension - increase compare to about month ago- see flow sheet Neg for lateral epincondyle pain - or Cubital tunnel - had some pain over medial epicondyle   After paraffin and heat  Soft tissue mobs done to volar forearm to hand and over CT  Pt with some tightness at lateral epicondyle and volar aspect of forearm/wrist  - responded good on  graston tools 2 more than 4 - using sweeping and scooping  with followed by PROM composite extention of wrist and digits  With more ease and less pain   Wrist  extention with elbow extended  5 reps each in neutral and supination position - pt to do at home   Pt lost HEP reviewed again with pt   R wrist - wrist splint for night time Med N glides 5 reps Tendon glides 10 reps And to do ulnar N glide 5 reps Needed mod  A and mod v/c for doing correctly and not forcing it   Ed on to cont avoiding tight grip in combination with wrist flexion  And lat grip with wrist flexion  And then for forearm to cont with heat - massage - and extended arm - with wrist extention stretch - open hand - in supination and neutral              OT Education - 05/12/16 1247    Education provided Yes   Education Details HEP reviewed again   [employer]) Educated Patient   Methods Explanation;Demonstration;Tactile cues;Verbal cues;Handout   Comprehension Verbal cues required;Returned demonstration;Verbalized understanding          OT Short Term Goals - 05/12/16 1249      OT SHORT TERM GOAL #1   Title Pain and numbness in R hand improve with at least 20 points on PRWHE    Baseline At eval score 36/50 - report numbness one time at least a day    Time 3   Period Weeks   Status On-going           OT Long Term Goals - 05/12/16 1250      OT LONG TERM GOAL #1   Title Grip strength in R hand improve with 10 lbs to not drop objects , and do hair,  carry dog food   Baseline see flowsheet   Time 4   Period Weeks   Status On-going     OT LONG TERM GOAL #2   Title Pt to be in  HEP to decrease pain at elbow and numbness/pain in R hand/wrist    Baseline forgot HEP - and lost paper   Time 4   Period Weeks   Status On-going               Plan - 05/12/16 1248    Clinical Impression Statement Pt cont to have some tighteness and pain over volar forearm and hand over CT - grip and prehension strength improve compare to last time - but pain still in hand and then elbow - fluctuate - pt is working  more hours at [employer] now - pt had nerve conduction yesterday - await appt with MD    Rehab Potential Fair   OT Frequency 1x / week   OT Duration 4 weeks   OT Treatment/Interventions Splinting;Patient/family education;Therapeutic exercises;Contrast Bath;Passive range of motion;Manual Therapy;Parrafin;Fluidtherapy   Plan assess progress and if appt with MD yet   OT Home Exercise Plan see pt instruction   Consulted and Agree with Plan of Care Patient      Patient will benefit from skilled therapeutic intervention in order to improve the following deficits and impairments:  Impaired flexibility, Impaired sensation, Pain, Impaired UE functional use  Visit Diagnosis: Other disturbances of skin sensation  Pain in right arm  Muscle weakness (generalized)    Problem List Patient Active Problem List   Diagnosis Date Noted  . Rash and nonspecific skin eruption 12/18/2014  . History of bursitis 08/03/2014  . Post traumatic stress disorder (PTSD) 05/28/2014  . Mood swings (HCC) 05/28/2014  . Migraines 05/28/2014    Oletta Cohn OTR/L,CLT 05/12/2016, 12:52 PM  [place] [facility] PHYSICAL AND SPORTS MEDICINE [address] [place], [address] Phone: [phone]   Fax:  [phone]  Name: Ericca Zuhlke MRN: [id number] Date of Birth: [birth year]

## 2016-05-12 NOTE — Patient Instructions (Addendum)
Wrist  extention with elbow extended  5 reps each in neutral and supination position - pt to do at home   Pt lost HEP reviewed again with pt   R wrist - wrist splint for night time Med N glides 5 reps Tendon glides 10 reps And to do ulnar N glide 5 reps  Ed on to cont avoiding tight grip in combination with wrist flexion  And lat grip with wrist flexion  And then for forearm to cont with heat - massage - and extended arm - with wrist extention stretch - open hand - in supination and neutral

## 2016-05-20 ENCOUNTER — Ambulatory Visit: Payer: Worker's Compensation | Admitting: Occupational Therapy

## 2016-05-20 DIAGNOSIS — M6281 Muscle weakness (generalized): Secondary | ICD-10-CM

## 2016-05-20 DIAGNOSIS — R208 Other disturbances of skin sensation: Principal | ICD-10-CM

## 2016-05-20 DIAGNOSIS — M79601 Pain in right arm: Secondary | ICD-10-CM

## 2016-05-20 NOTE — Patient Instructions (Addendum)
  Wrist  extention with elbow extended to the side - composite Med N glides   1 lbs weight - for RD, UD , wrist extention<> neutral ; sup/pro  10 reps each - supported  To do only 1 x day - and 1 set  Ed on to cont avoiding tight grip in combination with wrist flexion  And lat grip with wrist flexion  And then for forearm to cont with heat - massage - composite Med N glide  And 1 lbs HEP for wrist with forearm supported

## 2016-05-20 NOTE — Therapy (Signed)
[facility] [address] [place], [place], [address] Phone: [phone]   Fax:  [phone]  Occupational Therapy Treatment  Patient Details  Name: Emma RaddleMelody Rehfeld MRN: [id number] Date of Birth: 02-27-1976 Referring Provider: Harrie JeansNeidecker  Encounter Date: 05/20/2016      OT End of Session - 05/20/16 0829    Visit Number 5   Number of Visits 8   Date for OT Re-Evaluation 06/09/16   OT Start Time 0815   OT Stop Time 0856   OT Time Calculation (min) 41 min   Activity Tolerance Patient tolerated treatment well   Behavior During Therapy [facility] for tasks assessed/performed      Past Medical History:  Diagnosis Date  . Arthritis   . Depression   . Ulcer 2006    Past Surgical History:  Procedure Laterality Date  . 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      Subjective Assessment - 05/20/16 0826    Subjective  Seen MD yesterday - my hand and elbow ache more - I could not pick up the large tea can at work - it is worse since the nerve conduction test    Patient Stated Goals Want to get the numbness and pain better at my R elbow and hand - don't want to drop things, pick up or hold things , go back to work , help with taking care of animal   Currently in Pain? Yes   Pain Score 6    Pain Location Elbow   Pain Orientation Right   Pain Descriptors / Indicators Aching                      OT Treatments/Exercises (OP) - 05/20/16 0001      RUE Paraffin   Number Minutes Paraffin 10 Minutes   RUE Paraffin Location Hand   Comments AT SOC with heatingpad - large on forearm and hand  to decrease pain       Neg for lateral epincondyle pain - or Cubital tunnel - had some pain over medial epicondyle  Tightness and pain with wrist and elbow flexion - and some with pronation   After paraffin and heat  Soft tissue mobs done to volar forearm to hand and over CT  Pt  with some tightness at volar aspect of forearm/wrist - medial epicondyle  - responded good on graston tools 2 more than 4 - using sweeping and scooping with followed by PROM composite extention of wrist and digits  With more ease and less pain   Wrist  extention with elbow extended to the side - composite Med N glides   1 lbs weight - for RD, UD , wrist extention<> neutral ; sup/pro  10 reps each - supported  To do only 1 x day - and 1 set  Ed on to cont avoiding tight grip in combination with wrist flexion  And lat grip with wrist flexion  And then for forearm to cont with heat - massage - composite Med N glide  And 1 lbs HEP for wrist with forearm supported                 OT Education - 05/20/16 0829    Education provided Yes   Education Details HEP changes   Person(s) Educated Patient   Methods Explanation;Demonstration;Tactile cues;Verbal cues   Comprehension Verbal cues required;Returned demonstration;Verbalized understanding  OT Short Term Goals - 05/12/16 1249      OT SHORT TERM GOAL #1   Title Pain and numbness in R hand improve with at least 20 points on PRWHE    Baseline At eval score 36/50 - report numbness one time at least a day    Time 3   Period Weeks   Status On-going           OT Long Term Goals - 05/12/16 1250      OT LONG TERM GOAL #1   Title Grip strength in R hand improve with 10 lbs to not drop objects , and do hair, carry dog food   Baseline see flowsheet   Time 4   Period Weeks   Status On-going     OT LONG TERM GOAL #2   Title Pt to be in  HEP to decrease pain at elbow and numbness/pain in R hand/wrist    Baseline forgot HEP - and lost paper   Time 4   Period Weeks   Status On-going               Plan - 05/20/16 0829    Clinical Impression Statement Pt present this date still tender and tightness over medial epincondyle and distally to it - as well as pain at wrist and elbow- pt to do nerve glides -  and soft tissue at home -and intiated some strengthenging supported - would recommend at this time maybe ionto with dexamethazone -  will ask for order and pt await MRI that was ordered by  MD    Rehab Potential Fair   OT Frequency 1x / week   OT Duration 4 weeks   OT Treatment/Interventions Splinting;Patient/family education;Therapeutic exercises;Contrast Bath;Passive range of motion;Manual Therapy;Parrafin;Fluidtherapy   Plan assess  how did with HEP - 1 lbs    OT Home Exercise Plan see pt instruction   Consulted and Agree with Plan of Care Patient      Patient will benefit from skilled therapeutic intervention in order to improve the following deficits and impairments:  Impaired flexibility, Impaired sensation, Pain, Impaired UE functional use  Visit Diagnosis: Other disturbances of skin sensation  Pain in right arm  Muscle weakness (generalized)    Problem List Patient Active Problem List   Diagnosis Date Noted  . Rash and nonspecific skin eruption 12/18/2014  . History of bursitis 08/03/2014  . Post traumatic stress disorder (PTSD) 05/28/2014  . Mood swings (HCC) 05/28/2014  . Migraines 05/28/2014    Oletta CohnuPreez, Kharizma Lesnick OTR/L,CLT 05/20/2016, 10:30 AM  [facility] [address] [place], [place], [address] Phone: [phone]   Fax:  [phone]  Name: Emma RaddleMelody Lemoine MRN: [id number] Date of Birth: Jun 21, 1975

## 2016-05-27 ENCOUNTER — Ambulatory Visit: Payer: Worker's Compensation | Admitting: Occupational Therapy

## 2016-05-27 DIAGNOSIS — M6281 Muscle weakness (generalized): Secondary | ICD-10-CM

## 2016-05-27 DIAGNOSIS — M79601 Pain in right arm: Secondary | ICD-10-CM

## 2016-05-27 DIAGNOSIS — R208 Other disturbances of skin sensation: Principal | ICD-10-CM

## 2016-05-27 NOTE — Patient Instructions (Addendum)
cont avoiding tight grip in combination with wrist flexion  And lat grip with wrist flexion  And then for forearm to cont with heat - massage - ice-composite Med N glide  And 1 lbs HEP for wrist with forearm supported   And composite med N glide

## 2016-05-27 NOTE — Therapy (Signed)
[place] [facility] [address] [place], [address] Phone: [phone]   Fax:  [phone]  Occupational Therapy Treatment  Patient Details  Name: Riko Schmied MRN: [id number] Date of Birth: [birth year] Referring Provider: Harrie Jeans  Encounter Date: 05/27/2016      OT End of Session - 05/27/16 0833    Visit Number 6   Number of Visits 8   Date for OT Re-Evaluation 06/09/16   OT Start Time 0817   OT Stop Time 0857   OT Time Calculation (min) 40 min   Activity Tolerance Patient tolerated treatment well   Behavior During Therapy [facility] for tasks assessed/performed      Past Medical History:  Diagnosis Date  . Arthritis   . Depression   . Ulcer 2006    Past Surgical History:  Procedure Laterality Date  . 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      Subjective Assessment - 05/27/16 0829    Subjective  My pain is better than last time - but still about 4/10 - had MRI yesterday in [facility]    Patient Stated Goals Want to get the numbness and pain better at my R elbow and hand - don't want to drop things, pick up or hold things , go back to work , help with taking care of animal   Currently in Pain? Yes   Pain Score 4    Pain Location Elbow   Pain Orientation Right   Pain Descriptors / Indicators Aching                      OT Treatments/Exercises (OP) - 05/27/16 0001      RUE Paraffin   Number Minutes Paraffin 10 Minutes   RUE Paraffin Location Hand   Comments AT SOC to hand and elbow - to decrease pain  and stiffness    Cont to have pain and tenderness distal to r medial epicondyle  Tightness and pain  Less than last week - but still more over volar forearm this date    After paraffin and heat  Soft tissue mobs done to volar forearm  Pt with some tightness at volar aspect of forearm - medial epicondyle - responded good on graston  tools 2 more than 4 - using sweeping and scooping with followed by PROM composite extention of elbow/wrist and digits With more ease and less pain   Wrist extention with elbow extended to the side - composite Med N glides 5 x hold 5 sec  1 lbs weight - for RD, UD , wrist extention<> neutral ; sup/pro  10 reps each - supported  To do only 1 x day - and 1 set  If pain not increase and off from work to increase to 2 sets  Needed mod v/c for HEP              OT Education - 05/27/16 [phone]    Education provided Yes   Education Details HEP changes   Person(s) Educated Patient   Methods Explanation;Demonstration;Tactile cues;Verbal cues   Comprehension Verbal cues required;Returned demonstration;Verbalized understanding          OT Short Term Goals - 05/12/16 1249      OT SHORT TERM GOAL #1   Title Pain and numbness in R hand improve with at least 20 points on PRWHE    Baseline At eval score 36/50 -  report numbness one time at least a day    Time 3   Period Weeks   Status On-going           OT Long Term Goals - 05/12/16 1250      OT LONG TERM GOAL #1   Title Grip strength in R hand improve with 10 lbs to not drop objects , and do hair, carry dog food   Baseline see flowsheet   Time 4   Period Weeks   Status On-going     OT LONG TERM GOAL #2   Title Pt to be in  HEP to decrease pain at elbow and numbness/pain in R hand/wrist    Baseline forgot HEP - and lost paper   Time 4   Period Weeks   Status On-going               Plan - 05/27/16 6295    Clinical Impression Statement Pt report this date tightness and pain feels better - was off from work for 3 days-  did not had increase issues with HEP - but pain still at elbow more than hand - pt still tenderness and pain at medial epicondyle -  await ionto order from MD     Rehab Potential Fair   OT Frequency 1x / week   OT Duration 4 weeks   Plan assess progress and check if recieved order from MD for  ionto    OT Home Exercise Plan see pt instruction   Consulted and Agree with Plan of Care Patient      Patient will benefit from skilled therapeutic intervention in order to improve the following deficits and impairments:  Impaired flexibility, Impaired sensation, Pain, Impaired UE functional use  Visit Diagnosis: Other disturbances of skin sensation  Pain in right arm  Muscle weakness (generalized)    Problem List Patient Active Problem List   Diagnosis Date Noted  . Rash and nonspecific skin eruption 12/18/2014  . History of bursitis 08/03/2014  . Post traumatic stress disorder (PTSD) 05/28/2014  . Mood swings (HCC) 05/28/2014  . Migraines 05/28/2014    Oletta Cohn OTR/L,CLT 05/27/2016, 9:01 AM  [facility] PHYSICAL AND SPORTS MEDICINE [address], [place], [address] Phone: [phone]   Fax:  [phone]  Name: Vanecia Grime MRN: [id number] Date of Birth: 1975-08-23

## 2016-06-01 ENCOUNTER — Encounter: Payer: Self-pay | Admitting: Occupational Therapy

## 2016-06-02 ENCOUNTER — Ambulatory Visit: Payer: Worker's Compensation | Admitting: Occupational Therapy

## 2016-06-03 ENCOUNTER — Ambulatory Visit: Payer: Worker's Compensation | Attending: Family Medicine | Admitting: Occupational Therapy

## 2016-06-03 DIAGNOSIS — R208 Other disturbances of skin sensation: Secondary | ICD-10-CM | POA: Insufficient documentation

## 2016-06-03 DIAGNOSIS — M79601 Pain in right arm: Secondary | ICD-10-CM | POA: Insufficient documentation

## 2016-06-03 DIAGNOSIS — M6281 Muscle weakness (generalized): Secondary | ICD-10-CM | POA: Insufficient documentation

## 2016-06-08 ENCOUNTER — Ambulatory Visit: Payer: Worker's Compensation | Admitting: Occupational Therapy

## 2016-06-10 ENCOUNTER — Ambulatory Visit: Payer: Worker's Compensation | Admitting: Occupational Therapy

## 2016-06-16 ENCOUNTER — Ambulatory Visit: Payer: Worker's Compensation | Admitting: Occupational Therapy

## 2016-06-24 ENCOUNTER — Ambulatory Visit: Payer: Worker's Compensation | Admitting: Occupational Therapy

## 2016-06-24 DIAGNOSIS — R208 Other disturbances of skin sensation: Principal | ICD-10-CM

## 2016-06-24 DIAGNOSIS — M6281 Muscle weakness (generalized): Secondary | ICD-10-CM | POA: Diagnosis present

## 2016-06-24 DIAGNOSIS — M79601 Pain in right arm: Secondary | ICD-10-CM | POA: Diagnosis present

## 2016-06-24 NOTE — Patient Instructions (Addendum)
Heat to elbow  Cross friction massage to lateral epicondyle  And pt ed on doing at home  Extensor stretches done - extended arm - with loose fist in pronation and neutral position - 5 x 5 sec - passive done - and add to HEP    Pt to do ice massage at end

## 2016-06-24 NOTE — Therapy (Signed)
[facility] [address] [place], [place], [address] Phone: [phone]   Fax:  [phone]  Occupational Therapy Treatment  Patient Details  Name: Emma Hicks MRN: [id number] Date of Birth: 1975-12-21 Referring Provider: Harrie JeansNeidecker  Encounter Date: 06/24/2016      OT End of Session - 06/24/16 1925    Visit Number 7   Number of Visits 15   Date for OT Re-Evaluation 07/22/16   OT Start Time 0952   OT Stop Time 1030   OT Time Calculation (min) 38 min   Activity Tolerance Patient tolerated treatment well   Behavior During Therapy [facility] for tasks assessed/performed      Past Medical History:  Diagnosis Date  . Arthritis   . Depression   . Ulcer 2006    Past Surgical History:  Procedure Laterality Date  . 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      Subjective Assessment - 06/24/16 1012    Subjective  MRI showed tennis elbow - seen MD yesterday - need to do with you treatment and ionto - and phone him in month and let him know if it is getting better - I have problem - I am applying for new job and if I get it - training is 8-5 for 3 wks 5 x wk -    Patient Stated Goals Want to get the numbness and pain better at my R elbow and hand - don't want to drop things, pick up or hold things , go back to work , help with taking care of animal   Currently in Pain? Yes   Pain Score 6    Pain Location Elbow   Pain Orientation Right   Pain Descriptors / Indicators Aching   Pain Type Chronic pain            [facility] OT Assessment - 06/24/16 0001      Strength   Right Hand Grip (lbs) 42  35 extended arm   Right Hand Lateral Pinch 13 lbs   Right Hand 3 Point Pinch 12 lbs   Left Hand Grip (lbs) 40  extention 36   Left Hand Lateral Pinch 11 lbs   Left Hand 3 Point Pinch 9 lbs       PREE was done this date score 38/50 pain   function 16/50            OT  Treatments/Exercises (OP) - 06/24/16 0001      Moist Heat Therapy   Number Minutes Moist Heat 10 Minutes   Moist Heat Location Elbow     RUE Paraffin   Number Minutes Paraffin 10 Minutes   RUE Paraffin Location Hand   [facility] pain  in wrist and hand                 OT Education - 06/24/16 1925    Education provided Yes   Education Details HEP updated for lateral epicondyle   Person(s) Educated Patient   Methods Explanation;Demonstration;Tactile cues;Verbal cues;Handout   Comprehension Returned demonstration;Verbalized understanding;Verbal cues required          OT Short Term Goals - 05/12/16 1249      OT SHORT TERM GOAL #1   Title Pain and numbness in R hand improve with at least 20 points on PRWHE    Baseline At eval score 36/50 - report numbness one time  at least a day    Time 3   Period Weeks   Status On-going           OT Long Term Goals - 05/12/16 1250      OT LONG TERM GOAL #1   Title Grip strength in R hand improve with 10 lbs to not drop objects , and do hair, carry dog food   Baseline see flowsheet   Time 4   Period Weeks   Status On-going     OT LONG TERM GOAL #2   Title Pt to be in  HEP to decrease pain at elbow and numbness/pain in R hand/wrist    Baseline forgot HEP - and lost paper   Time 4   Period Weeks   Status On-going               Plan - 06/24/16 1926    Clinical Impression Statement Pt report that MRI showed lateral epicondylitis - seen MD yesterday - order to see OT for ionto with dexamethazone - and phone him in 3 wks how she is doing - pt was tender at lateral epicondyle - extention arm grip test  weaker , pain with resistance to middle finger extention    Rehab Potential Fair   OT Frequency 2x / week   OT Duration 4 weeks   OT Treatment/Interventions Splinting;Patient/family education;Therapeutic exercises;Contrast Bath;Passive range of motion;Manual Therapy;Parrafin;Fluidtherapy   Plan ionto  cont    OT Home Exercise Plan see pt instruction   Consulted and Agree with Plan of Care Patient      Patient will benefit from skilled therapeutic intervention in order to improve the following deficits and impairments:  Impaired flexibility, Impaired sensation, Pain, Impaired UE functional use  Visit Diagnosis: Other disturbances of skin sensation  Pain in right arm  Muscle weakness (generalized)    Problem List Patient Active Problem List   Diagnosis Date Noted  . Rash and nonspecific skin eruption 12/18/2014  . History of bursitis 08/03/2014  . Post traumatic stress disorder (PTSD) 05/28/2014  . Mood swings (HCC) 05/28/2014  . Migraines 05/28/2014    Oletta Cohn OTR/L,CLT 06/24/2016, 8:12 PM  [facility] PH[facility] SPORTS MEDICINE [address], [place], [address] Phone: [phone]   Fax:  [phone]  Name: Emma Hicks MRN: [id number] Date of Birth: March 31, 1976

## 2016-06-24 NOTE — Therapy (Signed)
[facility] [address] [place], [place], [address] Phone: [phone]   Fax:  [phone]  Occupational Therapy Treatment  Patient Details  Name: Emma RaddleMelody Hicks MRN: [id number] Date of Birth: [birth year] Referring Provider: Harrie JeansNeidecker  Encounter Date: 06/24/2016      OT End of Session - 06/24/16 1925    Visit Number 7   Number of Visits 15   Date for OT Re-Evaluation 07/22/16   OT Start Time 0952   OT Stop Time 1030   OT Time Calculation (min) 38 min   Activity Tolerance Patient tolerated treatment well   Behavior During Therapy [facility] for tasks assessed/performed      Past Medical History:  Diagnosis Date  . Arthritis   . Depression   . Ulcer 2006    Past Surgical History:  Procedure Laterality Date  . 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      Subjective Assessment - 06/24/16 1012    Subjective  MRI showed tennis elbow - seen MD yesterday - need to do with you treatment and ionto - and phone him in month and let him know if it is getting better - I have problem - I am applying for new job and if I get it - training is 8-5 for 3 wks 5 x wk -    Patient Stated Goals Want to get the numbness and pain better at my R elbow and hand - don't want to drop things, pick up or hold things , go back to work , help with taking care of animal   Currently in Pain? Yes   Pain Score 6    Pain Location Elbow   Pain Orientation Right   Pain Descriptors / Indicators Aching   Pain Type Chronic pain            [facility] OT Assessment - 06/24/16 0001      Strength   Right Hand Grip (lbs) 42  35 extended arm   Right Hand Lateral Pinch 13 lbs   Right Hand 3 Point Pinch 12 lbs   Left Hand Grip (lbs) 40  extention 36   Left Hand Lateral Pinch 11 lbs   Left Hand 3 Point Pinch 9 lbs                  OT Treatments/Exercises (OP) - 06/24/16 0001      Moist Heat  Therapy   Number Minutes Moist Heat 10 Minutes   Moist Heat Location Elbow     RUE Paraffin   Number Minutes Paraffin 10 Minutes   RUE Paraffin Location Hand   [facility]  [facility] wrist and hand       Positive test for 3rd digit extention - and wrist extention - with resistance  Palpation pain - tenderness over lateral epicondyle - Grip assess - see flowsheet-  Extended arm and to side   After paraffin to hand  and heat to elbow After heat done some soft tissue mobs - using Graston tools nr 2 and 4 over dorsal forearm - tender at lateral epicondyle   Cross friction massage to lateral epicondyle  And pt ed on doing at home  Extensor stretches done - extended arm - with loose fist in pronation and neutral position - 5 x 5 sec - passive done - and add to HEP Joint mobs to proximal radius  head - 5 reps  And gentle traction   ionto done- skin check done prior  Ionto on lateral epicondyle - - medium patch - 2.0 current  19 min  pt ed on what to expect  And skin check done afterwards              OT Education - 06/24/16 1925    Education provided Yes   Education Details HEP updated for lateral epicondyle   Person(s) Educated Patient   Methods Explanation;Demonstration;Tactile cues;Verbal cues;Handout   Comprehension Returned demonstration;Verbalized understanding;Verbal cues required          OT Short Term Goals - 05/12/16 1249      OT SHORT TERM GOAL #1   Title Pain and numbness in R hand improve with at least 20 points on PRWHE    Baseline At eval score 36/50 - report numbness one time at least a day    Time 3   Period Weeks   Status On-going           OT Long Term Goals - 05/12/16 1250      OT LONG TERM GOAL #1   Title Grip strength in R hand improve with 10 lbs to not drop objects , and do hair, carry dog food   Baseline see flowsheet   Time 4   Period Weeks   Status On-going     OT LONG TERM GOAL #2   Title Pt to be in  HEP to  decrease pain at elbow and numbness/pain in R hand/wrist    Baseline forgot HEP - and lost paper   Time 4   Period Weeks   Status On-going               Plan - 06/24/16 1926    Clinical Impression Statement Pt report that MRI showed lateral epicondylitis - seen MD yesterday - order to see OT for ionto with dexamethazone - and phone him in 3 wks how she is doing - pt was tender at lateral epicondyle - extention arm grip test  weaker , pain with resistance to middle finger extention    Rehab Potential Fair   OT Frequency 2x / week   OT Duration 4 weeks   OT Treatment/Interventions Splinting;Patient/family education;Therapeutic exercises;Contrast Bath;Passive range of motion;Manual Therapy;Parrafin;Fluidtherapy   Plan ionto cont    OT Home Exercise Plan see pt instruction   Consulted and Agree with Plan of Care Patient      Patient will benefit from skilled therapeutic intervention in order to improve the following deficits and impairments:  Impaired flexibility, Impaired sensation, Pain, Impaired UE functional use  Visit Diagnosis: Other disturbances of skin sensation  Pain in right arm  Muscle weakness (generalized)    Problem List Patient Active Problem List   Diagnosis Date Noted  . Rash and nonspecific skin eruption 12/18/2014  . History of bursitis 08/03/2014  . Post traumatic stress disorder (PTSD) 05/28/2014  . Mood swings (HCC) 05/28/2014  . Migraines 05/28/2014    Oletta Cohn OTR/L,CLT 06/24/2016, 7:35 PM  [facility] PHYSICAL AND SPORTS MEDICINE [address], [place], [address] Phone: [phone]   Fax:  [phone]  Name: Emma Hicks MRN: [id number] Date of Birth: July 01, 1975

## 2016-06-29 ENCOUNTER — Ambulatory Visit: Payer: Worker's Compensation | Admitting: Occupational Therapy

## 2016-07-01 ENCOUNTER — Ambulatory Visit: Payer: Worker's Compensation | Admitting: Occupational Therapy

## 2016-07-08 ENCOUNTER — Ambulatory Visit: Payer: Worker's Compensation | Attending: Family Medicine | Admitting: Occupational Therapy

## 2016-07-26 ENCOUNTER — Ambulatory Visit: Payer: Worker's Compensation | Admitting: Occupational Therapy

## 2016-08-28 IMAGING — MR MR HIP*L* W/CM
4 of 5 series · 21 of 40 positions shown · IV contrast (agent unspecified)
Comparison: None.

CLINICAL DATA: Left hip pain extending down the lateral aspect to
the level of the knee.

EXAM:
MRI OF THE LEFT HIP WITH CONTRAST(MR Arthrogram)
TECHNIQUE: Multiplanar, multisequence MR imaging of the hip was performed
immediately following contrast injection into the hip joint under
fluoroscopic guidance. No intravenous contrast was administered.

[Series 3: T1 fat-sat · axial · 4.0mm · 0.39mm/px · z∈[-25,+94]mm · 8 of 28 slices shown (1 of 3)]
[im 1/28]
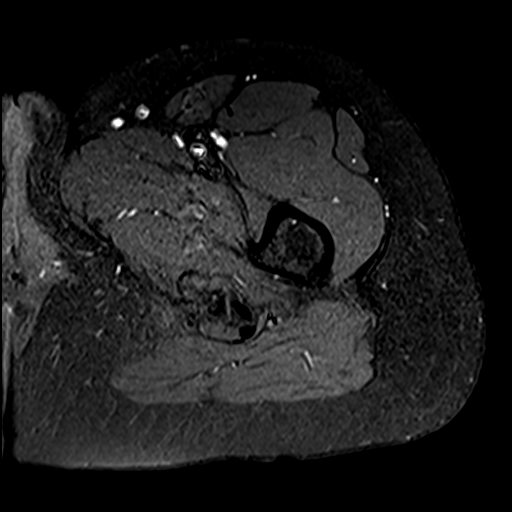
[im 4/28]
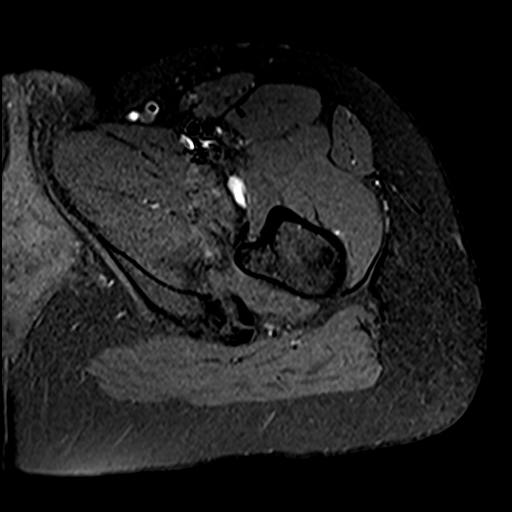
[im 10/28]
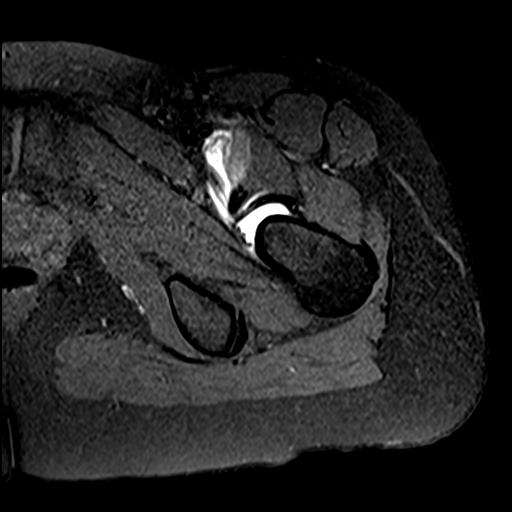
[im 13/28]
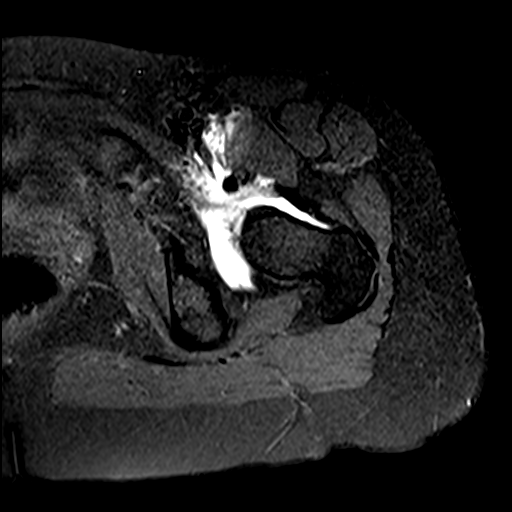
[im 16/28]
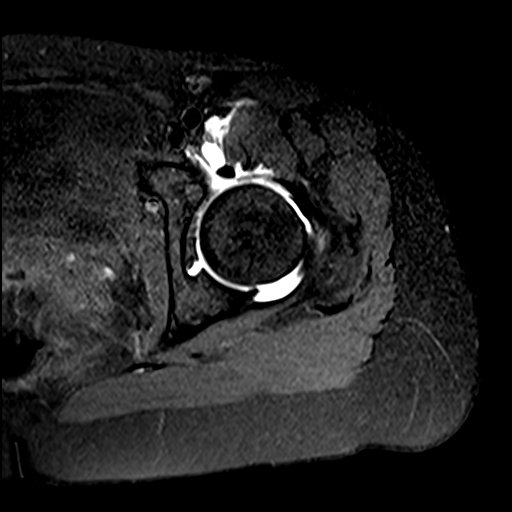
[im 19/28]
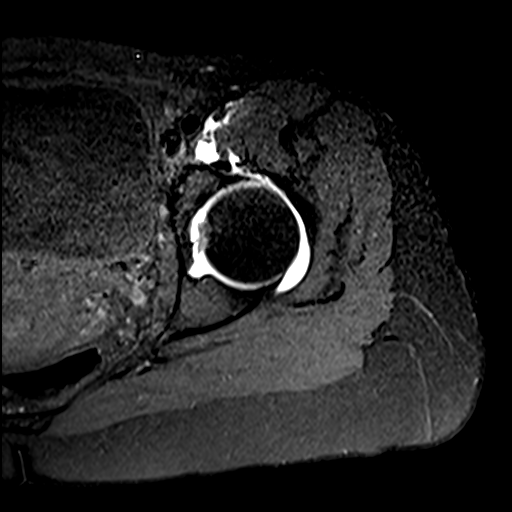
[im 25/28]
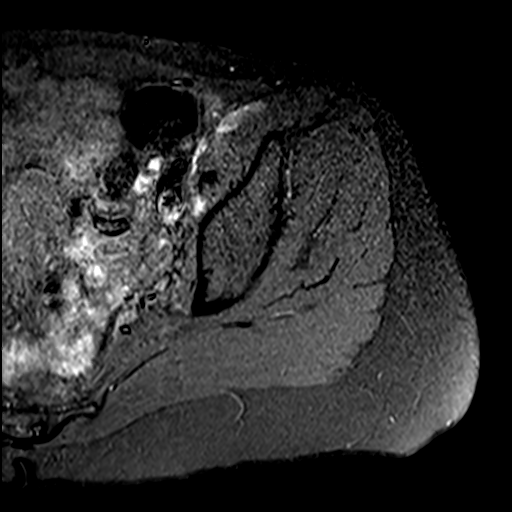
[im 28/28]
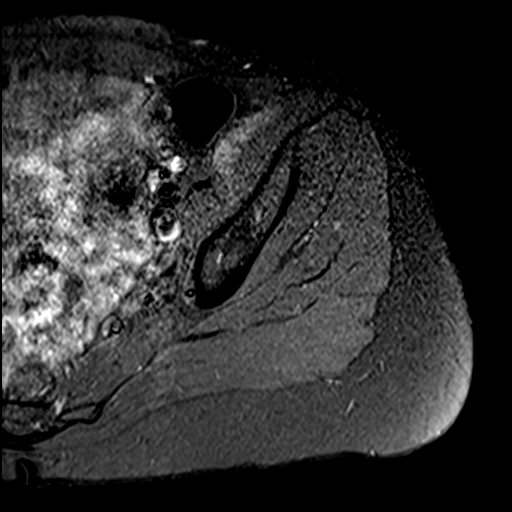

[Series 4: T1 fat-sat · coronal · 4.0mm · 0.39mm/px · 3 of 22 slices shown (2 of 3)]
[im 4/22]
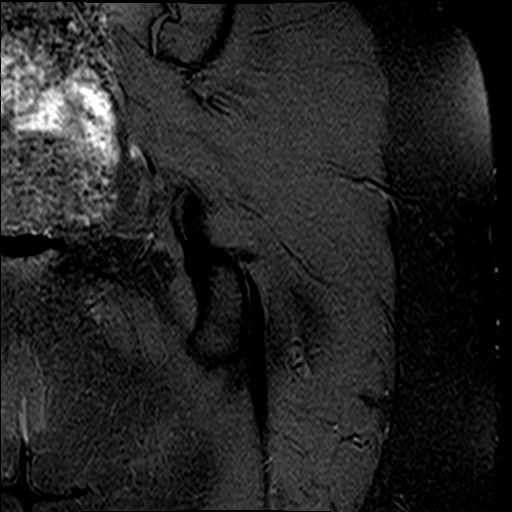
[im 11/22]
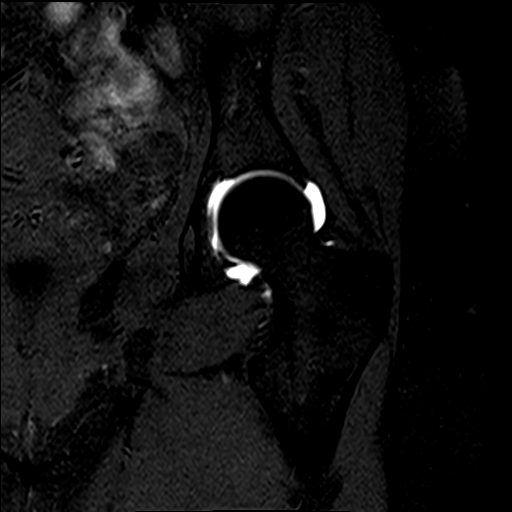
[im 18/22]
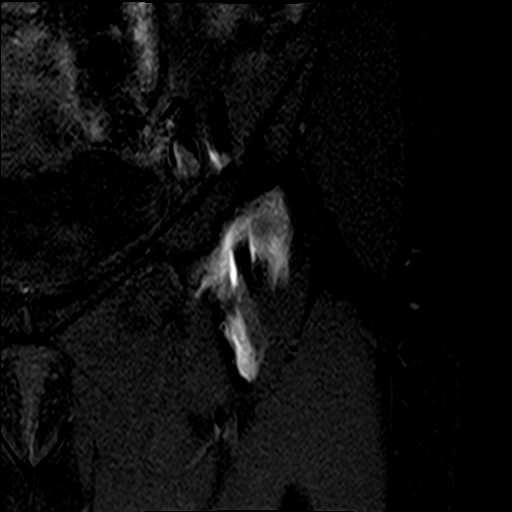

[Series 5: T1 fat-sat · sagittal · 4.0mm · 0.78mm/px · 3 of 23 slices shown (3 of 3)]
[im 4/23]
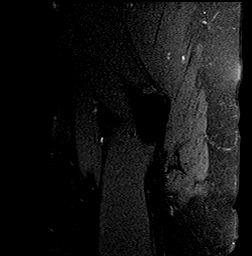
[im 13/23]
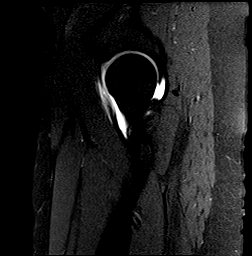
[im 19/23]
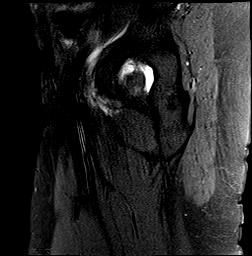

[Series 7: T2 fat-sat · coronal · 4.0mm · 0.78mm/px · 7 of 22 slices shown]
[im 1/22]
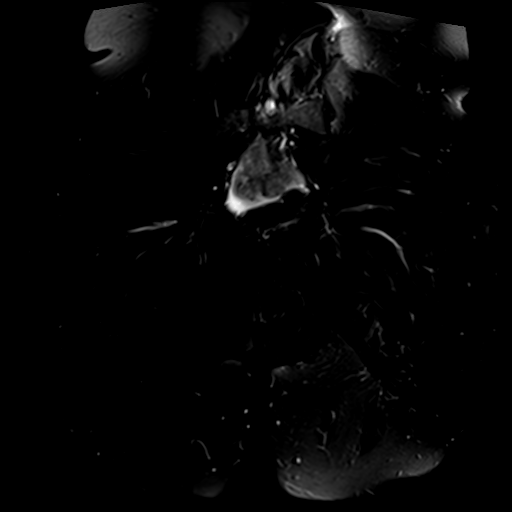
[im 4/22]
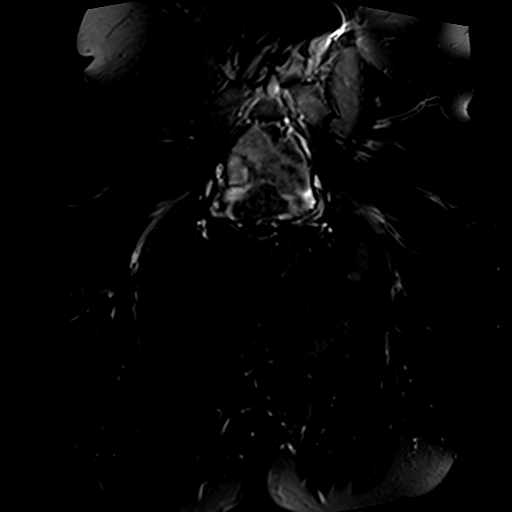
[im 8/22]
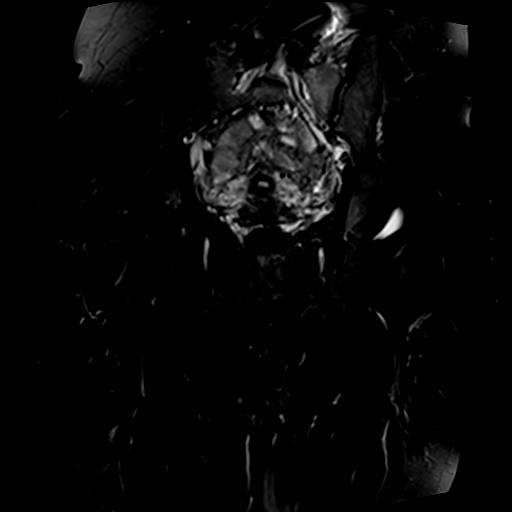
[im 11/22]
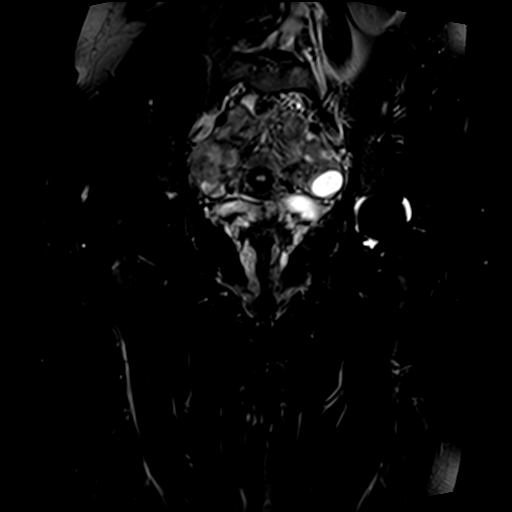
[im 15/22]
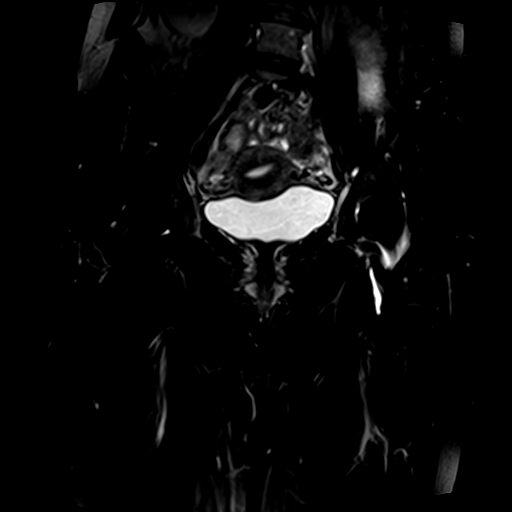
[im 18/22]
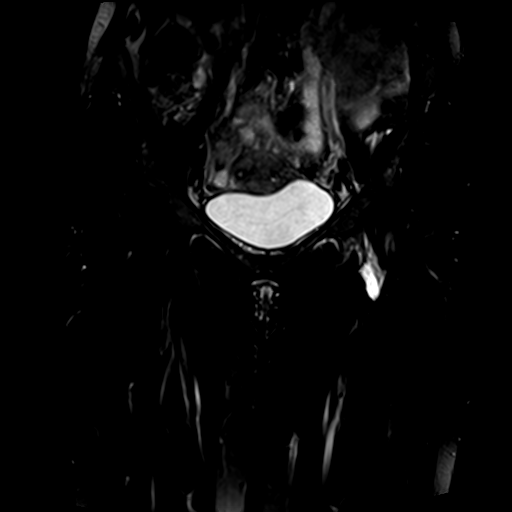
[im 22/22]
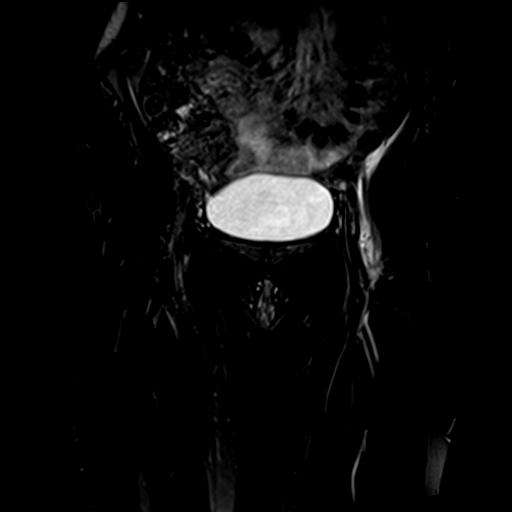

[21 of 40 positions shown; findings below may reference images not displayed]

FINDINGS: Bone

Marrow: Normal

Alignment

Normal. No subluxation.

Dysplasia

None.

Joint effusion

Intraarticular contrast in the left hip.

Labrum

Normal. No labral tear.

Cartilage

Femoral cartilage: Normal.

Acetabular cartilage: Normal.

Capsule and ligaments

Normal.

Muscles and Tendons

Flexors: Normal.

Extensors: Normal.

Abductors: Normal.

Adductors: Normal.

Rotators: Normal.

Hamstrings: Normal.

Other Findings

Small amount of contrast in the iliopsoas bursa which is iatrogenic.

Viscera

Normal. No abnormality seen in pelvis. No lymphadenopathy. No free
fluid in the pelvis.
IMPRESSION: 1. Normal left hip MR Arthrogram.
1. No

## 2016-12-12 ENCOUNTER — Emergency Department
Admission: EM | Admit: 2016-12-12 | Discharge: 2016-12-12 | Disposition: A | Discharge status: Home / Self Care | Payer: Managed Care, Other (non HMO) | Attending: Emergency Medicine | Admitting: Emergency Medicine

## 2016-12-12 DIAGNOSIS — Z5321 Procedure and treatment not carried out due to patient leaving prior to being seen by health care provider: Secondary | ICD-10-CM | POA: Insufficient documentation

## 2016-12-12 DIAGNOSIS — Z111 Encounter for screening for respiratory tuberculosis: Secondary | ICD-10-CM | POA: Insufficient documentation

## 2016-12-12 NOTE — ED Notes (Signed)
FIRST NURSE NOTE-PT would like detox. NAD at this time. ambulatory

## 2016-12-12 NOTE — ED Notes (Addendum)
Pt to ED for TB test and physical. Pt informed [facility] by TTS and this RN. Pt decided that she would rather go there in the AM. This RN advised pt to come back to ED if she felt like she was DTing at home and informed of the symptoms by this RN as well. Pt is leaving ED ambulatory with steady gait and VS WDL.

## 2016-12-20 DIAGNOSIS — Z139 Encounter for screening, unspecified: Secondary | ICD-10-CM

## 2016-12-20 LAB — GLUCOSE, POCT (MANUAL RESULT ENTRY): POC Glucose: 114 mg/dl — AB (ref 70–99)

## 2016-12-21 ENCOUNTER — Encounter: Payer: Self-pay | Admitting: Physician Assistant

## 2016-12-21 ENCOUNTER — Ambulatory Visit: Payer: Self-pay | Admitting: Physician Assistant

## 2016-12-21 VITALS — BP 86/52 | HR 88 | Temp 97.7°F | Ht 63.0 in | Wt 120.0 lb

## 2016-12-21 DIAGNOSIS — M419 Scoliosis, unspecified: Secondary | ICD-10-CM

## 2016-12-21 DIAGNOSIS — F1911 Other psychoactive substance abuse, in remission: Secondary | ICD-10-CM

## 2016-12-21 DIAGNOSIS — Z130 Encounter for screening for diseases of the blood and blood-forming organs and certain disorders involving the immune mechanism: Secondary | ICD-10-CM

## 2016-12-21 DIAGNOSIS — W57XXXA Bitten or stung by nonvenomous insect and other nonvenomous arthropods, initial encounter: Secondary | ICD-10-CM

## 2016-12-21 DIAGNOSIS — Z131 Encounter for screening for diabetes mellitus: Secondary | ICD-10-CM

## 2016-12-21 DIAGNOSIS — F17210 Nicotine dependence, cigarettes, uncomplicated: Secondary | ICD-10-CM

## 2016-12-21 DIAGNOSIS — Z9889 Other specified postprocedural states: Secondary | ICD-10-CM

## 2016-12-21 DIAGNOSIS — Z1159 Encounter for screening for other viral diseases: Secondary | ICD-10-CM

## 2016-12-21 DIAGNOSIS — Z13220 Encounter for screening for lipoid disorders: Secondary | ICD-10-CM

## 2016-12-21 MED ORDER — TRIAMCINOLONE ACETONIDE 0.1 % EX CREA: 1.0000 "application " | TOPICAL_CREAM | Freq: Two times a day (BID) | CUTANEOUS | 0 refills | Status: DC | PRN

## 2016-12-21 NOTE — Congregational Nurse Program (Signed)
Congregational Nurse Program Note  Date of Encounter: 12/20/2016  Past Medical History: Past Medical History:  Diagnosis Date  . Arthritis   . Depression   . Ulcer 2006    Encounter Details:     CNP Questionnaire - 12/20/16 1600      Patient Demographics   Is this a new or existing patient? New   Patient is considered a/an Not Applicable   Race Caucasian/White     Patient Assistance   Location of Patient Assistance [facility]   Patient's financial/insurance status Low Income;Private Insurance Coverage   Uninsured Patient ([place] Card/[employer]s) Yes   Interventions Counseled to make appt. with provider;Assisted patient in making appt.   Patient referred to apply for the following financial assistance Not Applicable   Food insecurities addressed Not Applicable   Transportation assistance No   Assistance securing medications No   Educational health offerings Not Applicable;Navigating the healthcare system     Encounter Details   Primary purpose of visit Acute Illness/Condition Visit;Education/Health Concerns;Navigating the Healthcare System   Was an Emergency Department visit averted? Not Applicable   Does patient have a medical provider? No   Patient referred to Clinic;Establish PCP   Was a mental health screening completed? (GAINS tool) No   Does patient have dental issues? No   Does patient have vision issues? No   Does your patient have an abnormal blood pressure today? No   Since previous encounter, have you referred patient for abnormal blood pressure that resulted in a new diagnosis or medication change? No   Does your patient have an abnormal blood glucose today? No   Since previous encounter, have you referred patient for abnormal blood glucose that resulted in a new diagnosis or medication change? No   Was there a life-saving intervention made? No     New client to [facility]. [facility]. She has been there  about 3 days. She [facility]. She presents today with a 4 day history of some type of "hives or breaking out". She reports that after being [place] [facility] she has had several red itchy spots. Around upper body and legs. She states she has tried anti itch creams and lotions and nothing has helped.  Medical History: Anxiety Depression Arthritis GERD Substance abuse ( last used Crack Cocaine one week ago) Migraines   Surgical History Harrington rod placement for Scoliosis 1993  Client oriented to person and place, answers questions appropriately. Does appear slightly nervous and anxious. Multiple areas of red areas that are on her upper body as well as lower extremities especially lower legs and ankles. Areas are severely itchy per client, except when she is asleep. RN encouraged client to not scratch areas due to increasing chances of infection. Client states she has tried over the counter anti-itch creams such as hydrocortisone cream, benadryl  Cream and gold bond eczema cream.  Encouraged client to possibly try some type of oatmeal bath for itching relief and to re wash clothing in a skin sensitive detergent. Client states understanding.  Referral into the free clinic made and appointment secured for 12/21/16 at 1:30 pm.   Vital signs today as noted in EPIC  POCT glucose non-fasting 114 Blood pressure 109/73 Pulse 96 Temp 97.7 orally  Will follow as needed and after initial  free clinic appointment.

## 2016-12-21 NOTE — Progress Notes (Signed)
BP (!) 86/52 (BP Location: Left Arm, Patient Position: Sitting, Cuff Size: Normal)   Pulse 88   Temp 97.7 F (36.5 C) (Other (Comment))   Ht 5\' 3"  (1.6 m)   Wt 120 lb (54.4 kg)   LMP 11/21/2016 (Approximate)   SpO2 95%   BMI 21.26 kg/m    Subjective:    Patient ID: Emma Hicks, female    DOB: June 05, 1975, 41 y.o.   MRN: [id number]  HPI: Emma Hicks is a 41 y.o. female presenting on 12/21/2016 for New Patient (Initial Visit) (first noted itching Friday evening, now has spread to other areas, itches)   HPI   [facility] Friday [facility].  Pt only noticed the itching and rash on Friday night.  Pt took zyrtec and it helped a little bit but not much.  Pt says she had the bites around her ankles [facility] but the other areas only appeared after being there.   [facility]    [facility]  Relevant past medical, surgical, family and social history reviewed and updated as indicated. Interim medical history since our last visit reviewed. Allergies and medications reviewed and updated.   Current Outpatient Prescriptions:  .  ibuprofen (ADVIL,MOTRIN) 200 MG tablet, Take 800 mg by mouth as needed., Disp: , Rfl:  .  Multiple Vitamin (MULTIVITAMIN) tablet, Take 1 tablet by mouth daily., Disp: , Rfl:   Review of Systems  Constitutional: Positive for fatigue. Negative for appetite change, chills, diaphoresis, fever and unexpected weight change.  HENT: Negative for congestion, dental problem, drooling, ear pain, facial swelling, hearing loss, mouth sores, sneezing, sore throat, trouble swallowing and voice change.   Eyes: Negative for pain, discharge, redness, itching and visual disturbance.  Respiratory: Negative for cough, choking, shortness of breath and wheezing.   Cardiovascular: Negative for chest pain, palpitations and leg swelling.  Gastrointestinal: Negative for abdominal  pain, blood in stool, constipation, diarrhea and vomiting.  Endocrine: Negative for cold intolerance, heat intolerance and polydipsia.  Genitourinary: Negative for decreased urine volume, dysuria and hematuria.  Musculoskeletal: Positive for arthralgias and back pain. Negative for gait problem.  Skin: Positive for rash.  Allergic/Immunologic: Negative for environmental allergies.  Neurological: Negative for seizures, syncope, light-headedness and headaches.  Hematological: Negative for adenopathy.  Psychiatric/Behavioral: Positive for dysphoric mood. Negative for agitation and suicidal ideas. The patient is nervous/anxious.     Per HPI unless specifically indicated above     Objective:    BP (!) 86/52 (BP Location: Left Arm, Patient Position: Sitting, Cuff Size: Normal)   Pulse 88   Temp 97.7 F (36.5 C) (Other (Comment))   Ht 5\' 3"  (1.6 m)   Wt 120 lb (54.4 kg)   LMP 11/21/2016 (Approximate)   SpO2 95%   BMI 21.26 kg/m   Wt Readings from Last 3 Encounters:  12/21/16 120 lb (54.4 kg)  12/20/16 118 lb 6.4 oz (53.7 kg)  12/12/16 120 lb (54.4 kg)    Physical Exam  Constitutional: She is oriented to person, place, and time. She appears well-developed and well-nourished.  HENT:  Head: Normocephalic and atraumatic.  Mouth/Throat: Oropharynx is clear and moist. No oropharyngeal exudate.  Eyes: Pupils are equal, round, and reactive to light. Conjunctivae and EOM are normal.  Neck: Neck supple. No thyromegaly present.  Cardiovascular: Normal rate and regular rhythm.   Pulmonary/Chest: Effort normal and breath sounds normal.  Abdominal:  Soft. Bowel sounds are normal. She exhibits no mass. There is no hepatosplenomegaly. There is no tenderness.  Musculoskeletal: She exhibits no edema.  Scarring of the back from previous surgery  Lymphadenopathy:    She has no cervical adenopathy.  Neurological: She is alert and oriented to person, place, and time. Gait normal.  Skin: Skin is warm  and dry.     Scattered papules consistent with insect bites of different varieties and time frames.  Bites around ankles obviously older with more healing. Some excoriation.  Bites on lower back larger with some excoriation and consistent with mosquito bite.  Bites on posterior neck and upper arms are much smaller, a fainter pink. Some excoriation.  There is no secondary infection of any.  No abscess.   There are no bites visible in webspaces, ante-cubital fossa or other flexures.    Psychiatric: She has a normal mood and affect. Her behavior is normal.  Vitals reviewed.   Results for orders placed or performed in visit on 12/20/16  POCT glucose  Result Value Ref Range   POC Glucose 114 (A) 70 - 99 mg/dl      Assessment & Plan:   Encounter Diagnoses  Name Primary?  . Scoliosis, unspecified scoliosis type, unspecified spinal region Yes  . History of back surgery   . Cigarette nicotine dependence without complication   . Substance abuse in remission   . Insect bite, initial encounter   . Screening, anemia, deficiency, iron   . Need for hepatitis C screening test   . Screening cholesterol level   . Screening for diabetes mellitus      -Pt to get Fasting labs drawn tomorrow morning -discussed with pt that her "rash" is bites so steroid PO wouldn't "cure" it.  She states understanding.  Can take benedryl PO to help with itching but it will cause sedation so she can use claritin to help with that some when she doesn't want the sedation.  rx TAC cream to use on the bites.  Also discussed other things that can help like rubbing with ice cube to decrease the itching and avoiding overly hot showers. -Record [facility] for most [facility] [place] -pt to continue with [facility] for counseling -pt to follow up in one month.  RTO sooner prn

## 2016-12-24 ENCOUNTER — Other Ambulatory Visit (HOSPITAL_COMMUNITY)
Admission: RE | Admit: 2016-12-24 | Discharge: 2016-12-24 | Disposition: A | Discharge status: Home / Self Care | Payer: Managed Care, Other (non HMO) | Source: Ambulatory Visit | Attending: Physician Assistant | Admitting: Physician Assistant

## 2016-12-24 DIAGNOSIS — F1911 Other psychoactive substance abuse, in remission: Secondary | ICD-10-CM

## 2016-12-24 DIAGNOSIS — Z131 Encounter for screening for diabetes mellitus: Secondary | ICD-10-CM

## 2016-12-24 DIAGNOSIS — Z1159 Encounter for screening for other viral diseases: Secondary | ICD-10-CM

## 2016-12-24 DIAGNOSIS — Z13220 Encounter for screening for lipoid disorders: Secondary | ICD-10-CM

## 2016-12-24 DIAGNOSIS — Z130 Encounter for screening for diseases of the blood and blood-forming organs and certain disorders involving the immune mechanism: Secondary | ICD-10-CM

## 2016-12-24 LAB — COMPREHENSIVE METABOLIC PANEL
ALT: 13 U/L — AB (ref 14–54)
ANION GAP: 6 (ref 5–15)
AST: 19 U/L (ref 15–41)
Albumin: 4.1 g/dL (ref 3.5–5.0)
Alkaline Phosphatase: 50 U/L (ref 38–126)
BUN: 18 mg/dL (ref 6–20)
CHLORIDE: 101 mmol/L (ref 101–111)
CO2: 30 mmol/L (ref 22–32)
Calcium: 8.7 mg/dL — ABNORMAL LOW (ref 8.9–10.3)
Creatinine, Ser: 0.67 mg/dL (ref 0.44–1.00)
Glucose, Bld: 98 mg/dL (ref 65–99)
POTASSIUM: 4.3 mmol/L (ref 3.5–5.1)
Sodium: 137 mmol/L (ref 135–145)
Total Bilirubin: 0.3 mg/dL (ref 0.3–1.2)
Total Protein: 7 g/dL (ref 6.5–8.1)

## 2016-12-24 LAB — CBC
HEMATOCRIT: 37.8 % (ref 36.0–46.0)
HEMOGLOBIN: 12.6 g/dL (ref 12.0–15.0)
MCH: 30.3 pg (ref 26.0–34.0)
MCHC: 33.3 g/dL (ref 30.0–36.0)
MCV: 90.9 fL (ref 78.0–100.0)
PLATELETS: 193 10*3/uL (ref 150–400)
RBC: 4.16 MIL/uL (ref 3.87–5.11)
RDW: 12.4 % (ref 11.5–15.5)
WBC: 4.5 10*3/uL (ref 4.0–10.5)

## 2016-12-24 LAB — LIPID PANEL
CHOL/HDL RATIO: 3.1 ratio
Cholesterol: 199 mg/dL (ref 0–200)
HDL: 64 mg/dL (ref >40)
LDL CALC: 125 mg/dL — AB (ref 0–99)
Triglycerides: 50 mg/dL (ref <150)
VLDL: 10 mg/dL (ref 0–40)

## 2016-12-25 LAB — HEMOGLOBIN A1C
HEMOGLOBIN A1C: 5.3 % (ref 4.8–5.6)
MEAN PLASMA GLUCOSE: 105 mg/dL

## 2016-12-25 LAB — HEPATITIS C ANTIBODY: HCV Ab: <0.1 s/co ratio (ref 0.0–0.9)

## 2016-12-28 ENCOUNTER — Telehealth: Payer: Self-pay

## 2016-12-28 NOTE — Telephone Encounter (Signed)
   [facility]. [facility]. Chief complaint of visit was a "rash" and has "hives and or breaking out".   Referral was made into the Free Clinic and appointment was scheduled for 12/21/16 at 1:30 pm.   Follow-up call was made to patient but there was no answer. Message was left for patient to call back if need be.     Emma Hicks R. Marik Sedore LPN [phone]

## 2016-12-30 ENCOUNTER — Encounter: Payer: Self-pay | Admitting: Physician Assistant

## 2017-01-20 ENCOUNTER — Encounter: Payer: Self-pay | Admitting: Physician Assistant

## 2017-01-20 ENCOUNTER — Ambulatory Visit: Payer: Self-pay | Admitting: Physician Assistant

## 2017-01-20 VITALS — BP 80/56 | HR 90 | Temp 97.9°F | Ht 63.0 in | Wt 124.2 lb

## 2017-01-20 DIAGNOSIS — F17210 Nicotine dependence, cigarettes, uncomplicated: Secondary | ICD-10-CM

## 2017-01-20 DIAGNOSIS — M25551 Pain in right hip: Secondary | ICD-10-CM

## 2017-01-20 DIAGNOSIS — F1911 Other psychoactive substance abuse, in remission: Secondary | ICD-10-CM

## 2017-01-20 DIAGNOSIS — M25552 Pain in left hip: Principal | ICD-10-CM

## 2017-01-20 DIAGNOSIS — E785 Hyperlipidemia, unspecified: Secondary | ICD-10-CM

## 2017-01-20 MED ORDER — CELECOXIB 100 MG PO CAPS: 100.0000 mg | ORAL_CAPSULE | Freq: Two times a day (BID) | ORAL | 1 refills | Status: DC | PRN

## 2017-01-20 NOTE — Progress Notes (Signed)
BP (!) 80/56 (BP Location: Left Arm, Patient Position: Sitting, Cuff Size: Normal)   Pulse 90   Temp 97.9 F (36.6 C)   Ht 5\' 3"  (1.6 m)   Wt 124 lb 4 oz (56.4 kg)   SpO2 97%   BMI 22.01 kg/m    Subjective:    Patient ID: Emma Hicks, female    DOB: 1975/12/06, 41 y.o.   MRN: [id number]  HPI: Emma Hicks is a 41 y.o. female presenting on 01/20/2017 for Follow-up   HPI   Pt still at [facility] house.  She is doing well.  Her itching/bites problem resolved.   Pt is still going to [facility] for counseling  Pt says she is having problems with her hips hurting.  She feels like they are popping out of joint.   Pt states both are bad now.  Pt had arthrogram in 2016.     Pt states mostly okay on flat surfaces but it's really bad doing stairs and standing up.  Pt was on celebrex for this in the past and says it helped a lot.   Relevant past medical, surgical, family and social history reviewed and updated as indicated. Interim medical history since our last visit reviewed. Allergies and medications reviewed and updated.   Current Outpatient Prescriptions:  .  citalopram (CELEXA) 20 MG tablet, Take 20 mg by mouth daily., Disp: , Rfl:  .  ibuprofen (ADVIL,MOTRIN) 200 MG tablet, Take 800 mg by mouth as needed., Disp: , Rfl:  .  Multiple Vitamin (MULTIVITAMIN) tablet, Take 1 tablet by mouth daily., Disp: , Rfl:  .  traZODone (DESYREL) 100 MG tablet, Take 100 mg by mouth at bedtime., Disp: , Rfl:    Review of Systems  Constitutional: Negative for appetite change, chills, diaphoresis, fatigue, fever and unexpected weight change.  HENT: Positive for sneezing. Negative for congestion, dental problem, drooling, ear pain, facial swelling, hearing loss, mouth sores, sore throat, trouble swallowing and voice change.   Eyes: Positive for itching. Negative for pain, discharge, redness and visual disturbance.  Respiratory: Negative for cough, choking, shortness of breath and wheezing.    Cardiovascular: Negative for chest pain, palpitations and leg swelling.  Gastrointestinal: Negative for abdominal pain, blood in stool, constipation, diarrhea and vomiting.  Endocrine: Negative for cold intolerance, heat intolerance and polydipsia.  Genitourinary: Negative for decreased urine volume, dysuria and hematuria.  Musculoskeletal: Positive for arthralgias, back pain and gait problem.  Skin: Negative for rash.  Allergic/Immunologic: Negative for environmental allergies.  Neurological: Negative for seizures, syncope, light-headedness and headaches.  Hematological: Negative for adenopathy.  Psychiatric/Behavioral: Positive for dysphoric mood. Negative for agitation and suicidal ideas. The patient is not nervous/anxious.     Per HPI unless specifically indicated above     Objective:    BP (!) 80/56 (BP Location: Left Arm, Patient Position: Sitting, Cuff Size: Normal)   Pulse 90   Temp 97.9 F (36.6 C)   Ht 5\' 3"  (1.6 m)   Wt 124 lb 4 oz (56.4 kg)   SpO2 97%   BMI 22.01 kg/m   Wt Readings from Last 3 Encounters:  01/20/17 124 lb 4 oz (56.4 kg)  12/21/16 120 lb (54.4 kg)  12/20/16 118 lb 6.4 oz (53.7 kg)    Physical Exam  Constitutional: She is oriented to person, place, and time. She appears well-developed and well-nourished.  HENT:  Head: Normocephalic and atraumatic.  Neck: Neck supple.  Cardiovascular: Normal rate and regular rhythm.   Pulses:  Dorsalis pedis pulses are 2+ on the right side, and 2+ on the left side.  Pulmonary/Chest: Effort normal and breath sounds normal.  Abdominal: Soft. Bowel sounds are normal. She exhibits no mass. There is no hepatosplenomegaly. There is no tenderness.  Musculoskeletal: She exhibits no edema.       Right hip: She exhibits tenderness. She exhibits normal range of motion, normal strength, no bony tenderness and no swelling.       Left hip: She exhibits tenderness. She exhibits normal range of motion, normal strength and no  bony tenderness.  Lymphadenopathy:    She has no cervical adenopathy.  Neurological: She is alert and oriented to person, place, and time.  Skin: Skin is warm and dry.  Psychiatric: She has a normal mood and affect. Her behavior is normal.  Vitals reviewed.   Results for orders placed or performed during the hospital encounter of 12/24/16  Comprehensive Metabolic Panel (CMET)  Result Value Ref Range   Sodium 137 135 - 145 mmol/L   Potassium 4.3 3.5 - 5.1 mmol/L   Chloride 101 101 - 111 mmol/L   CO2 30 22 - 32 mmol/L   Glucose, Bld 98 65 - 99 mg/dL   BUN 18 6 - 20 mg/dL   Creatinine, Ser 4.09 0.44 - 1.00 mg/dL   Calcium 8.7 (L) 8.9 - 10.3 mg/dL   Total Protein 7.0 6.5 - 8.1 g/dL   Albumin 4.1 3.5 - 5.0 g/dL   AST 19 15 - 41 U/L   ALT 13 (L) 14 - 54 U/L   Alkaline Phosphatase 50 38 - 126 U/L   Total Bilirubin 0.3 0.3 - 1.2 mg/dL   GFR calc non Af Amer >60 >60 mL/min   GFR calc Af Amer >60 >60 mL/min   Anion gap 6 5 - 15  CBC  Result Value Ref Range   WBC 4.5 4.0 - 10.5 K/uL   RBC 4.16 3.87 - 5.11 MIL/uL   Hemoglobin 12.6 12.0 - 15.0 g/dL   HCT 81.1 91.4 - 78.2 %   MCV 90.9 78.0 - 100.0 fL   MCH 30.3 26.0 - 34.0 pg   MCHC 33.3 30.0 - 36.0 g/dL   RDW 95.6 21.3 - 08.6 %   Platelets 193 150 - 400 K/uL  Lipid panel  Result Value Ref Range   Cholesterol 199 0 - 200 mg/dL   Triglycerides 50 <578 mg/dL   HDL 64 >46 mg/dL   Total CHOL/HDL Ratio 3.1 RATIO   VLDL 10 0 - 40 mg/dL   LDL Cholesterol 962 (H) 0 - 99 mg/dL  Hepatitis C Antibody  Result Value Ref Range   HCV Ab <0.1 0.0 - 0.9 s/co ratio  HgB A1c  Result Value Ref Range   Hgb A1c MFr Bld 5.3 4.8 - 5.6 %   Mean Plasma Glucose 105 mg/dL      Assessment & Plan:   Encounter Diagnoses  Name Primary?  . Pain of both hip joints Yes  . Hyperlipidemia, unspecified hyperlipidemia type   . Cigarette nicotine dependence without complication   . Substance abuse in remission     -reviewed labs with pt -counseled pt  on lowfat diet for lipids -Signed pt up for [facility] for celebrex.  Pt is counseled to stop the IBU and any other nsaids when she gets the celebrex -pt to follow up in 2months to recheck hips.  RTO sooner prn

## 2017-01-20 NOTE — Patient Instructions (Signed)
Fat and Cholesterol Restricted Diet High levels of fat and cholesterol in your blood may lead to various health problems, such as diseases of the heart, blood vessels, gallbladder, liver, and pancreas. Fats are concentrated sources of energy that come in various forms. Certain types of fat, including saturated fat, may be harmful in excess. Cholesterol is a substance needed by your body in small amounts. Your body makes all the cholesterol it needs. Excess cholesterol comes from the food you eat. When you have high levels of cholesterol and saturated fat in your blood, health problems can develop because the excess fat and cholesterol will gather along the walls of your blood vessels, causing them to narrow. Choosing the right foods will help you control your intake of fat and cholesterol. This will help keep the levels of these substances in your blood within normal limits and reduce your risk of disease. What is my plan? Your health care provider recommends that you:  Limit your fat intake to ______% or less of your total calories per day.  Limit the amount of cholesterol in your diet to less than _________mg per day.  Eat 20-30 grams of fiber each day.  What types of fat should I choose?  Choose healthy fats more often. Choose monounsaturated and polyunsaturated fats, such as olive and canola oil, flaxseeds, walnuts, almonds, and seeds.  Eat more omega-3 fats. Good choices include salmon, mackerel, sardines, tuna, flaxseed oil, and ground flaxseeds. Aim to eat fish at least two times a week.  Limit saturated fats. Saturated fats are primarily found in animal products, such as meats, butter, and cream. Plant sources of saturated fats include palm oil, palm kernel oil, and coconut oil.  Avoid foods with partially hydrogenated oils in them. These contain trans fats. Examples of foods that contain trans fats are stick margarine, some tub margarines, cookies, crackers, and other baked goods. What  general guidelines do I need to follow? These guidelines for healthy eating will help you control your intake of fat and cholesterol:  Check food labels carefully to identify foods with trans fats or high amounts of saturated fat.  Fill one half of your plate with vegetables and green salads.  Fill one fourth of your plate with whole grains. Look for the word "whole" as the first word in the ingredient list.  Fill one fourth of your plate with lean protein foods.  Limit fruit to two servings a day. Choose fruit instead of juice.  Eat more foods that contain fiber, such as apples, broccoli, carrots, beans, peas, and barley.  Eat more home-cooked food and less restaurant, buffet, and fast food.  Limit or avoid alcohol.  Limit foods high in starch and sugar.  Limit fried foods.  Cook foods using methods other than frying. Baking, boiling, grilling, and broiling are all great options.  Lose weight if you are overweight. Losing just 5-10% of your initial body weight can help your overall health and prevent diseases such as diabetes and heart disease.  What foods can I eat? Grains  Whole grains, such as whole wheat or whole grain breads, crackers, cereals, and pasta. Unsweetened oatmeal, bulgur, barley, quinoa, or brown rice. Corn or whole wheat flour tortillas. Vegetables  Fresh or frozen vegetables (raw, steamed, roasted, or grilled). Green salads. Fruits  All fresh, canned (in natural juice), or frozen fruits. Meats and other protein foods  Ground beef (85% or leaner), grass-fed beef, or beef trimmed of fat. Skinless chicken or turkey. Ground chicken or turkey.   Pork trimmed of fat. All fish and seafood. Eggs. Dried beans, peas, or lentils. Unsalted nuts or seeds. Unsalted canned or dry beans. Dairy  Low-fat dairy products, such as skim or 1% milk, 2% or reduced-fat cheeses, low-fat ricotta or cottage cheese, or plain low-fat yo Fats and oils  Tub margarines without trans  fats. Light or reduced-fat mayonnaise and salad dressings. Avocado. Olive, canola, sesame, or safflower oils. Natural peanut or almond butter (choose ones without added sugar and oil). The items listed above may not be a complete list of recommended foods or beverages. Contact your dietitian for more options. Foods to avoid Grains  White bread. White pasta. White rice. Cornbread. Bagels, pastries, and croissants. Crackers that contain trans fat. Vegetables  White potatoes. Corn. Creamed or fried vegetables. Vegetables in a cheese sauce. Fruits  Dried fruits. Canned fruit in light or heavy syrup. Fruit juice. Meats and other protein foods  Fatty cuts of meat. Ribs, chicken wings, bacon, sausage, bologna, salami, chitterlings, fatback, hot dogs, bratwurst, and packaged luncheon meats. Liver and organ meats. Dairy  Whole or 2% milk, cream, half-and-half, and cream cheese. Whole milk cheeses. Whole-fat or sweetened yogurt. Full-fat cheeses. Nondairy creamers and whipped toppings. Processed cheese, cheese spreads, or cheese curds. Beverages  Alcohol. Sweetened drinks (such as sodas, lemonade, and fruit drinks or punches). Fats and oils  Butter, stick margarine, lard, shortening, ghee, or bacon fat. Coconut, palm kernel, or palm oils. Sweets and desserts  Corn syrup, sugars, honey, and molasses. Candy. Jam and jelly. Syrup. Sweetened cereals. Cookies, pies, cakes, donuts, muffins, and ice cream. The items listed above may not be a complete list of foods and beverages to avoid. Contact your dietitian for more information. This information is not intended to replace advice given to you by your health care provider. Make sure you discuss any questions you have with your health care provider. Document Released: 05/17/2005 Document Revised: 06/07/2014 Document Reviewed: 08/15/2013 Elsevier Interactive Patient Education  2017 [employer] Inc.  

## 2017-01-24 ENCOUNTER — Telehealth: Payer: Self-pay

## 2017-01-24 NOTE — Telephone Encounter (Signed)
New patient arrived at the [facility] on 12/20/16. Pt is a resident at [facility] house for women. Chief complaint of visit was a "rash" and has "hives and or breaking out". Referral was made into the [facility] Clinic and appointment was scheduled for 12/21/16 at 1:30 pm.Follow-up call was made to patient July 31st but there was no answer. Message was left for patient to call back if need be.  Pt returned call today 01/24/2017 after receiving voicemail. Pt states she had just received the message but that she is doing good. Appreciative for the call.       Onyx Edgley R. Kailei Cowens  LPN [phone]

## 2017-02-17 ENCOUNTER — Ambulatory Visit: Payer: Self-pay | Admitting: Physician Assistant

## 2017-02-17 ENCOUNTER — Encounter: Payer: Self-pay | Admitting: Physician Assistant

## 2017-02-17 VITALS — BP 90/60 | HR 67 | Temp 97.5°F | Wt 124.0 lb

## 2017-02-17 DIAGNOSIS — M25552 Pain in left hip: Secondary | ICD-10-CM

## 2017-02-17 MED ORDER — RANITIDINE HCL 300 MG PO TABS: 300.0000 mg | ORAL_TABLET | Freq: Every day | ORAL | 3 refills | Status: DC

## 2017-02-17 MED ORDER — OMEPRAZOLE 40 MG PO CPDR: 40.0000 mg | DELAYED_RELEASE_CAPSULE | Freq: Every day | ORAL | 1 refills | Status: DC

## 2017-02-17 NOTE — Progress Notes (Signed)
BP 90/60 (BP Location: Left Arm, Patient Position: Sitting, Cuff Size: Normal)   Pulse 67   Temp (!) 97.5 F (36.4 C)   Wt 124 lb (56.2 kg)   SpO2 96%   BMI 21.97 kg/m    Subjective:    Patient ID: Emma Hicks, female    DOB: 23-Apr-1976, 41 y.o.   MRN: [id number]  HPI: Emma Hicks is a 41 y.o. female presenting on 02/17/2017 for Hip Pain (L side)   HPI   Pt has not gotten the celebrex from [facility] yet.   She has been using IBU but says it doesn't seem to help any.    Pt had normal L hip MRI 2016  Pt states L hip pain is worsening   Relevant past medical, surgical, family and social history reviewed and updated as indicated. Interim medical history since our last visit reviewed. Allergies and medications reviewed and updated.   Current Outpatient Prescriptions:  .  citalopram (CELEXA) 20 MG tablet, Take 20 mg by mouth daily., Disp: , Rfl:  .  ibuprofen (ADVIL,MOTRIN) 200 MG tablet, Take 800 mg by mouth as needed., Disp: , Rfl:  .  traZODone (DESYREL) 100 MG tablet, Take 100 mg by mouth at bedtime., Disp: , Rfl:  .  celecoxib (CELEBREX) 100 MG capsule, Take 1 capsule (100 mg total) by mouth 2 (two) times daily as needed. (Patient not taking: Reported on 02/17/2017), Disp: 180 capsule, Rfl: 1 .  Multiple Vitamin (MULTIVITAMIN) tablet, Take 1 tablet by mouth daily., Disp: , Rfl:    Review of Systems  Constitutional: Positive for fatigue. Negative for appetite change, chills, diaphoresis, fever and unexpected weight change.  HENT: Positive for sneezing. Negative for congestion, dental problem, drooling, ear pain, facial swelling, hearing loss, mouth sores, sore throat, trouble swallowing and voice change.   Eyes: Negative for pain, discharge, redness, itching and visual disturbance.  Respiratory: Positive for cough. Negative for choking, shortness of breath and wheezing.   Cardiovascular: Negative for chest pain, palpitations and leg swelling.  Gastrointestinal:  Negative for abdominal pain, blood in stool, constipation, diarrhea and vomiting.  Endocrine: Negative for cold intolerance, heat intolerance and polydipsia.  Genitourinary: Negative for decreased urine volume, dysuria and hematuria.  Musculoskeletal: Positive for arthralgias, back pain and gait problem.  Skin: Negative for rash.  Allergic/Immunologic: Negative for environmental allergies.  Neurological: Positive for light-headedness. Negative for seizures, syncope and headaches.  Hematological: Negative for adenopathy.  Psychiatric/Behavioral: Positive for dysphoric mood. Negative for agitation and suicidal ideas. The patient is nervous/anxious.     Per HPI unless specifically indicated above     Objective:    BP 90/60 (BP Location: Left Arm, Patient Position: Sitting, Cuff Size: Normal)   Pulse 67   Temp (!) 97.5 F (36.4 C)   Wt 124 lb (56.2 kg)   SpO2 96%   BMI 21.97 kg/m   Wt Readings from Last 3 Encounters:  02/17/17 124 lb (56.2 kg)  01/20/17 124 lb 4 oz (56.4 kg)  12/21/16 120 lb (54.4 kg)    Physical Exam  Constitutional: She is oriented to person, place, and time. She appears well-developed and well-nourished.  HENT:  Head: Normocephalic and atraumatic.  Pulmonary/Chest: Effort normal.  Musculoskeletal:       Left hip: She exhibits decreased range of motion and tenderness. She exhibits no swelling and no deformity.  Pain with ROM L hip.  Pt ambulating with slight limp due to pain  Neurological: She is alert and oriented to person,  place, and time.  Skin: Skin is warm and dry.  Psychiatric: She has a normal mood and affect. Her behavior is normal.  Nursing note and vitals reviewed.       Assessment & Plan:    Encounter Diagnosis  Name Primary?  . Left hip pain Yes    -rx omeprazole from [facility] -rx ranitidine to use until it arrives -will get Xray hip -pt is given [employer] discount application -will Refer to orthopedics for evaluation and treatment -pt  to follow up 1 month as scheduled

## 2017-02-18 ENCOUNTER — Ambulatory Visit (HOSPITAL_COMMUNITY)
Admission: RE | Admit: 2017-02-18 | Discharge: 2017-02-18 | Disposition: A | Discharge status: Home / Self Care | Payer: Self-pay | Source: Ambulatory Visit | Attending: Physician Assistant | Admitting: Physician Assistant

## 2017-02-18 DIAGNOSIS — M25552 Pain in left hip: Secondary | ICD-10-CM | POA: Insufficient documentation

## 2017-02-28 ENCOUNTER — Ambulatory Visit: Payer: Self-pay | Admitting: Physician Assistant

## 2017-03-22 ENCOUNTER — Ambulatory Visit: Payer: Self-pay | Admitting: Physician Assistant

## 2017-03-28 ENCOUNTER — Encounter: Payer: Self-pay | Admitting: Physician Assistant

## 2017-03-28 ENCOUNTER — Ambulatory Visit: Payer: Self-pay | Admitting: Physician Assistant

## 2017-03-28 VITALS — BP 96/66 | HR 87 | Temp 97.7°F | Wt 127.0 lb

## 2017-03-28 DIAGNOSIS — M25552 Pain in left hip: Principal | ICD-10-CM

## 2017-03-28 DIAGNOSIS — F17210 Nicotine dependence, cigarettes, uncomplicated: Secondary | ICD-10-CM

## 2017-03-28 DIAGNOSIS — K219 Gastro-esophageal reflux disease without esophagitis: Secondary | ICD-10-CM

## 2017-03-28 DIAGNOSIS — M25551 Pain in right hip: Secondary | ICD-10-CM

## 2017-03-28 DIAGNOSIS — E785 Hyperlipidemia, unspecified: Secondary | ICD-10-CM

## 2017-03-28 DIAGNOSIS — F1911 Other psychoactive substance abuse, in remission: Secondary | ICD-10-CM

## 2017-03-28 NOTE — Progress Notes (Signed)
BP 96/66 (BP Location: Left Arm, Patient Position: Sitting, Cuff Size: Normal)   Pulse 87   Temp 97.7 F (36.5 C)   Wt 127 lb (57.6 kg)   SpO2 97%   BMI 22.50 kg/m    Subjective:    Patient ID: Emma Hicks, female    DOB: Mar 17, 1976, 41 y.o.   MRN: [id number]  HPI: Emma Hicks is a 41 y.o. female presenting on 03/28/2017 for Follow-up   HPI   Pt still at [facility] house.  Notes in EPIC show orthopedic office left VM for pt about appt.  She has [employer] discount.   She says her stomach is doing great now that she is on the omeprazole.   She is still having pain R hip.  Pt had xray done L hip.  She says pain goes back and forth.   Relevant past medical, surgical, family and social history reviewed and updated as indicated. Interim medical history since our last visit reviewed. Allergies and medications reviewed and updated.   Current Outpatient Prescriptions:  .  celecoxib (CELEBREX) 100 MG capsule, Take 1 capsule (100 mg total) by mouth 2 (two) times daily as needed., Disp: 180 capsule, Rfl: 1 .  citalopram (CELEXA) 20 MG tablet, Take 20 mg by mouth daily., Disp: , Rfl:  .  omeprazole (PRILOSEC) 40 MG capsule, Take 1 capsule (40 mg total) by mouth daily., Disp: 90 capsule, Rfl: 1 .  traZODone (DESYREL) 100 MG tablet, Take 100 mg by mouth at bedtime., Disp: , Rfl:  .  Multiple Vitamin (MULTIVITAMIN) tablet, Take 1 tablet by mouth daily., Disp: , Rfl:    Review of Systems  Constitutional: Negative for appetite change, chills, diaphoresis, fatigue, fever and unexpected weight change.  HENT: Negative for congestion, dental problem, drooling, ear pain, facial swelling, hearing loss, mouth sores, sneezing, sore throat, trouble swallowing and voice change.   Eyes: Negative for pain, discharge, redness, itching and visual disturbance.  Respiratory: Negative for cough, choking, shortness of breath and wheezing.   Cardiovascular: Negative for chest pain, palpitations and leg  swelling.  Gastrointestinal: Negative for abdominal pain, blood in stool, constipation, diarrhea and vomiting.  Endocrine: Negative for cold intolerance, heat intolerance and polydipsia.  Genitourinary: Negative for decreased urine volume, dysuria and hematuria.  Musculoskeletal: Positive for arthralgias, back pain and gait problem.  Skin: Negative for rash.  Allergic/Immunologic: Negative for environmental allergies.  Neurological: Negative for seizures, syncope, light-headedness and headaches.  Hematological: Negative for adenopathy.  Psychiatric/Behavioral: Positive for agitation. Negative for dysphoric mood and suicidal ideas. The patient is nervous/anxious.     Per HPI unless specifically indicated above     Objective:    BP 96/66 (BP Location: Left Arm, Patient Position: Sitting, Cuff Size: Normal)   Pulse 87   Temp 97.7 F (36.5 C)   Wt 127 lb (57.6 kg)   SpO2 97%   BMI 22.50 kg/m   Wt Readings from Last 3 Encounters:  03/28/17 127 lb (57.6 kg)  02/17/17 124 lb (56.2 kg)  01/20/17 124 lb 4 oz (56.4 kg)    Physical Exam  Constitutional: She is oriented to person, place, and time. She appears well-developed and well-nourished.  HENT:  Head: Normocephalic and atraumatic.  Neck: Neck supple.  Cardiovascular: Normal rate and regular rhythm.   Pulmonary/Chest: Effort normal and breath sounds normal.  Abdominal: Soft. Bowel sounds are normal. She exhibits no mass. There is no hepatosplenomegaly. There is no tenderness.  Musculoskeletal: She exhibits no edema.  Lymphadenopathy:  She has no cervical adenopathy.  Neurological: She is alert and oriented to person, place, and time.  Skin: Skin is warm and dry.  Psychiatric: She has a normal mood and affect. Her behavior is normal.  Vitals reviewed.       Assessment & Plan:    Encounter Diagnoses  Name Primary?  . Gastroesophageal reflux disease, esophagitis presence not specified   . Pain of both hip joints Yes  .  Cigarette nicotine dependence without complication   . Substance abuse in remission (HCC)   . Hyperlipidemia, unspecified hyperlipidemia type     -Pt encouraged to call orthopedist back to schedule appointment -pt to continue current medications -[facility] -pt to follow up 3months.  RTO sooner prn

## 2017-03-28 NOTE — Patient Instructions (Addendum)
Orthopedist -- [phone] 

## 2017-04-13 ENCOUNTER — Encounter: Payer: Self-pay | Admitting: Orthopedic Surgery

## 2017-04-13 ENCOUNTER — Ambulatory Visit (INDEPENDENT_AMBULATORY_CARE_PROVIDER_SITE_OTHER): Payer: Self-pay | Admitting: Orthopedic Surgery

## 2017-04-13 VITALS — BP 88/58 | HR 80 | Ht 63.0 in | Wt 127.0 lb

## 2017-04-13 DIAGNOSIS — M76892 Other specified enthesopathies of left lower limb, excluding foot: Secondary | ICD-10-CM

## 2017-04-13 DIAGNOSIS — M76891 Other specified enthesopathies of right lower limb, excluding foot: Secondary | ICD-10-CM

## 2017-04-13 NOTE — Progress Notes (Signed)
NEW PATIENT OFFICE VISIT    Chief Complaint  Patient presents with  . Hip Pain    history of scoliosis/ treated with rods/ has had long duration of hip pain bilateral     41 year old female part of the [employer] financial assistance program presents with a history of scoliosis spinal fusion with Harrington rods at age 41 presents for evaluation of right hip pain although she has had a history of left hip pain as well.  She says the right hip feels like it is coming out of place pain is located anterior to the hip joint  It is a dull ache non radiating   No loss of motion   She was on Celebrex 200 mg twice a day was decreased to once a day due to concern related to her reflux disease.  She is on Prilosec for that    Review of Systems  Constitutional: Negative for chills, fever, malaise/fatigue and weight loss.  Musculoskeletal: Positive for back pain and joint pain. Negative for myalgias.     Past Medical History:  Diagnosis Date  . Arthritis   . Depression   . Ulcer 2006    Past Surgical History:  Procedure Laterality Date  . DILATION AND CURETTAGE OF UTERUS  2002  . herrington rods  1992   scoilosis    Family History  Problem Relation Age of Onset  . Asthma Mother   . Heart disease Mother   . Hypertension Mother   . Mental illness Mother   . Asthma Sister   . Hypertension Sister   . Aneurysm Sister 27       died  . Asthma Sister    Social History   Tobacco Use  . Smoking status: Current Every Day Smoker    Packs/day: 0.25    Years: 16.00    Pack years: 4.00  . Smokeless tobacco: Never Used  Substance Use Topics  . Alcohol use: Yes    Comment: very little  . Drug use: Yes    Types: "Crack" cocaine    Comment: none since 12/12/2016     Current Meds  Medication Sig  . celecoxib (CELEBREX) 100 MG capsule Take 1 capsule (100 mg total) by mouth 2 (two) times daily as needed.  . citalopram (CELEXA) 20 MG tablet Take 20 mg by mouth daily.  . Multiple  Vitamin (MULTIVITAMIN) tablet Take 1 tablet by mouth daily.  Marland Kitchen. omeprazole (PRILOSEC) 40 MG capsule Take 1 capsule (40 mg total) by mouth daily.  . traZODone (DESYREL) 100 MG tablet Take 100 mg by mouth at bedtime.    BP (!) 88/58   Pulse 80   Ht 5\' 3"  (1.6 m)   Wt 127 lb (57.6 kg)   BMI 22.50 kg/m   Physical Exam  Constitutional: She is oriented to person, place, and time. She appears well-developed and well-nourished.  Neurological: She is alert and oriented to person, place, and time.  Psychiatric: She has a normal mood and affect. Her behavior is normal. Judgment and thought content normal.    Right Hip Exam   Tenderness  The patient is experiencing tenderness in the anterior.  Range of Motion  The patient has normal right hip ROM.  Muscle Strength  The patient has normal right hip strength.  Tests  FABER: positive  Other  Erythema: absent Scars: absent Sensation: normal Pulse: present   Left Hip Exam   Tenderness  The patient is experiencing tenderness in the anterior.  Range of Motion  The patient has normal left hip ROM.  Muscle Strength  The patient has normal left hip strength.   Tests  FABER: positive  Other  Erythema: absent Scars: absent Sensation: normal Pulse: present     The AP of the pelvis shows acetabular index 45 center edge angle 30 degrees   No orders of the defined types were placed in this encounter.   Encounter Diagnoses  Name Primary?  . Hip flexor tendinitis, left Yes  . Hip flexor tendinitis, right      PLAN:   Recommend physical therapy 3 times a week for 6 weeks  Celebrex 200 mg twice a day with daily Prilosec and LFTs renal studies and hemoglobin twice a year

## 2017-04-25 ENCOUNTER — Other Ambulatory Visit: Payer: Self-pay

## 2017-04-25 ENCOUNTER — Ambulatory Visit (HOSPITAL_COMMUNITY): Payer: No Typology Code available for payment source | Attending: Orthopedic Surgery | Admitting: Physical Therapy

## 2017-04-25 DIAGNOSIS — M25551 Pain in right hip: Secondary | ICD-10-CM | POA: Insufficient documentation

## 2017-04-25 DIAGNOSIS — M25552 Pain in left hip: Secondary | ICD-10-CM | POA: Insufficient documentation

## 2017-04-25 DIAGNOSIS — M6281 Muscle weakness (generalized): Secondary | ICD-10-CM | POA: Insufficient documentation

## 2017-04-25 NOTE — Therapy (Signed)
[facility] [address] [place], [place], [address] Phone: [phone]   Fax:  [phone]  Physical Therapy Evaluation  Patient Details  Name: Emma Hicks MRN: [id number] Date of Birth: [birth year] Referring Provider: Fuller Canada   Encounter Date: 04/25/2017  Emma End of Session - 04/25/17 1334    Visit Number  1    Number of Visits  18    Date for Emma Re-Evaluation  05/25/17    Authorization Type  100% discount    Authorization - Visit Number  1    Authorization - Number of Visits  10    Emma Start Time  1030    Emma Stop Time  1115    Emma Time Calculation (min)  45 min    Activity Tolerance  Patient tolerated treatment well    Behavior During Therapy  [facility] for tasks assessed/performed       Past Medical History:  Diagnosis Date  . Arthritis   . Depression   . Ulcer 2006    Past Surgical History:  Procedure Laterality Date  . DILATION AND CURETTAGE OF UTERUS  2002  . 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   Subjective Assessment - 04/25/17 1050    Subjective  Emma Hicks states that she recently moved to [place].  Where she lives she has stairs to go to her room and more yard work which has aggrevated her hips.  Her hips bother her the most when she is sitting for prolong periods of time.  She has had chiropractic and medication treatments but has now been referred to skilled physical therapy.     Pertinent History  Harrington Rod.  Hx of back and hip pain,     Limitations  Reading;Lifting;Standing;Walking;Sitting    How long can you sit comfortably?  Depends on the chair but on an average 30-40 minutes.     How long can you stand comfortably?  no problem     How long can you walk comfortably?  no probelm     Currently in Pain?  No/denies Worst pain in the past week has been 6/10 .  Due to coldness          [facility] Emma Assessment - 04/25/17 0001      Assessment   Medical Diagnosis  B hip tendonitis    Referring Provider  Fuller Canada    Onset Date/Surgical Date  03/31/17    Next MD Visit  none    Prior Therapy  none      Precautions   Precautions  None      Restrictions   Weight Bearing Restrictions  No      Balance Screen   Has the patient fallen in the past 6 months  No    Has the patient had a decrease in activity level because of a fear of falling?   No    Is the patient reluctant to leave their home because of a fear of falling?   No      Home Environment   Living Environment  Private residence    Type of Home  House    Home Access  Stairs to enter more pain going up than down     Entrance Stairs-Number of Steps  10    Home Layout  Two level;Bed/bath upstairs      Prior Function   Level of Independence  Independent  Vocation  Unemployed    Leisure  walk       Cognition   Overall Cognitive Status  Within Functional Limits for tasks assessed      Observation/Other Assessments   Focus on Therapeutic Outcomes (FOTO)   60      Functional Tests   Functional tests  Single leg stance;Sit to Stand      Single Leg Stance   Comments  LT:  20;  Rt:  39      Sit to Stand   Comments   5  x  9.76       ROM / Strength   AROM / PROM / Strength  AROM;Strength      AROM   AROM Assessment Site  Hip    Right/Left Hip  Right;Left    Right Hip Flexion  130    Right Hip External Rotation   70    Right Hip Internal Rotation   50    Left Hip Flexion  130    Left Hip External Rotation   55    Left Hip Internal Rotation   53      Strength   Strength Assessment Site  Hip;Knee    Right/Left Hip  Right;Left    Right Hip Flexion  3-/5    Right Hip Extension  3/5    Right Hip ABduction  5/5    Left Hip Flexion  3-/5    Left Hip Extension  3/5    Left Hip ABduction  5/5    Right/Left Knee  Right;Left    Right Knee Flexion  3+/5    Right Knee Extension  5/5    Left Knee Flexion  4-/5    Left Knee Extension  5/5      Flexibility    Soft Tissue Assessment /Muscle Length  yes    Hamstrings  RT 130;  Lt 145             Objective measurements completed on examination: See above findings.      [facility] Adult Emma Treatment/Exercise - 04/25/17 0001      Exercises   Exercises  Knee/Hip      Knee/Hip Exercises: Stretches   Active Hamstring Stretch  Both;3 reps;30 seconds      Knee/Hip Exercises: Supine   Bridges  10 reps    Straight Leg Raises  Both;5 reps             Emma Education - 04/25/17 1333    Education provided  Yes    Education Details  HEP    Person(s) Educated  Patient    Methods  Explanation;Handout    Comprehension  Verbalized understanding       Emma Short Term Goals - 04/25/17 1341      Emma SHORT TERM GOAL #1   Title  Emma to be able to sit for 45 minutes without increased pain in either hip     Time  3    Period  Weeks    Status  New    Target Date  05/16/17      Emma SHORT TERM GOAL #2   Title  Emma to be able to walk for 20 minutes without having increased pain in either hip.    Time  3    Period  Weeks    Status  New      Emma SHORT TERM GOAL #3   Title  Emma to be able to balance on both LE for  45 seconds to reduce risk of falling.         Emma Long Term Goals - 04/25/17 1344      Emma LONG TERM GOAL #1   Title  Emma to be able to sit for 75 minutes without increased pain in either hip .    Time  6    Period  Weeks    Status  New    Target Date  06/06/17      Emma LONG TERM GOAL #2   Title  Emma to be able to walk for 40 mintues without having increase pain in either hip.     Time  6    Period  Weeks      Emma LONG TERM GOAL #3   Title  Emma B LE strength to increase by one grade to allow Emma to descend 10 steps without difficulty.      Period  Weeks    Status  New             Plan - 04/25/17 1335    Clinical Impression Statement  Emma Hicks is a 41 yo female who has had hip and back pain since she was  a teenager when due to her scoliosis she had a Harrington rod  placed in her back.  She has had medication and chiropractic treatment but she has never been seen for formal physical therapy.  Her pain has progressed in the recent months due to moving to a new location where she  has more yard work and has to go up a flight of stairs to get into her bedroom.   She therefore sought medical atttention who is referring her to out patient physical therapy.     Examination demonstrates decreased strength, decreased balance , decreased flexibility.  Emma Hicks will benefit from skilled physical therapy to educate her in a HEP.     History and Personal Factors relevant to plan of care:  scoliosis, Harrington rod placement.     Clinical Presentation  Stable    Clinical Decision Making  Low    Rehab Potential  Good    Emma Frequency  3x / week    Emma Duration  6 weeks    Emma Treatment/Interventions  ADLs/Self Care Home Management;Therapeutic activities;Therapeutic exercise;Balance training;Neuromuscular re-education;Manual techniques    Emma Next Visit Plan  begin sit to stand, vector stances, step ups, lateral step ups and lungers.  Progress stability and balance as able.     Emma Home Exercise Plan  eval:  bridge, SLR, hamstring stretch.        Patient will benefit from skilled therapeutic intervention in order to improve the following deficits and impairments:  Decreased balance, Difficulty walking, Decreased strength, Pain  Visit Diagnosis: Muscle weakness (generalized) - Plan: Emma plan of care cert/re-cert  Pain in right hip - Plan: Emma plan of care cert/re-cert  Pain in left hip - Plan: Emma plan of care cert/re-cert     Problem List Patient Active Problem List   Diagnosis Date Noted  . Rash and nonspecific skin eruption 12/18/2014  . History of bursitis 08/03/2014  . Post traumatic stress disorder (PTSD) 05/28/2014  . Mood swings 05/28/2014  . Migraines 05/28/2014   Emma Hicks, Emma Hicks [phone] 04/25/2017, 1:53 PM  [facility] [address] [place], [place], [address] Phone: [phone]   Fax:  [phone]  Name: Emma Hicks MRN: [id number] Date of Birth: 1975/08/10

## 2017-04-25 NOTE — Patient Instructions (Addendum)
Stretching: Hamstring (Supine)    Supporting right thigh behind knee, slowly straighten knee until stretch is felt in back of thigh. Hold _30___ seconds. Repeat _3___ times per set. Do _1___ sets per session. Do __2__ sessions per day.  http://orth.exer.us/656   Copyright  VHI. All rights reserved.  Strengthening: Straight Leg Raise (Phase 1)    Tighten muscles on front of right thigh, then lift leg __15__ inches from surface, keeping knee locked.  Repeat _10___ times per set. Do ___1_ sets per session. Do 2___ sessions per day.  http://orth.exer.us/614   Copyright  VHI. All rights reserved.  Bridging    Slowly raise buttocks from floor, keeping stomach tight. Repeat _10___ times per set. Do 1___ sets per session. Do ___2_ sessions per day.  http://orth.exer.us/1096   Copyright  VHI. All rights reserved.

## 2017-04-27 ENCOUNTER — Ambulatory Visit (HOSPITAL_COMMUNITY): Payer: No Typology Code available for payment source | Admitting: Physical Therapy

## 2017-04-27 DIAGNOSIS — M6281 Muscle weakness (generalized): Secondary | ICD-10-CM

## 2017-04-27 DIAGNOSIS — M25552 Pain in left hip: Secondary | ICD-10-CM

## 2017-04-27 DIAGNOSIS — M25551 Pain in right hip: Secondary | ICD-10-CM

## 2017-04-27 NOTE — Therapy (Addendum)
[facility] [address] [place], [place], [address] Phone: [phone]   Fax:  [phone]  Physical Therapy Treatment  Patient Details  Name: Emma Hicks MRN: [id number] Date of Birth: May 26, 1976 Referring Provider: Fuller Canada   Encounter Date: 04/27/2017    Past Medical History:  Diagnosis Date  . Arthritis   . Depression   . Ulcer 2006    Past Surgical History:  Procedure Laterality Date  . DILATION AND CURETTAGE OF UTERUS  2002  . 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  Subjective Assessment - 04/27/17 1525    Subjective  Pt states she is doing well, completing her HEP.  States she is mostly tight and stiff from the cold but not really hurting.    Currently in Pain?  No/denies            PT End of Session -     Visit Number  2    Number of Visits  18    Date for PT Re-Evaluation  05/25/17    Authorization Type  100% discount    Authorization - Visit Number  2    Authorization - Number of Visits  10    PT Start Time  1515   PT Stop Time  1600   PT Time Calculation (min)  45 min    Activity Tolerance  Patient tolerated treatment well    Behavior During Therapy  WFL for tasks assessed/performed                  [facility] Adult PT Treatment/Exercise - 04/27/17 0001      Knee/Hip Exercises: Stretches   Active Hamstring Stretch  Both;30 seconds;2 reps;Limitations    Active Hamstring Stretch Limitations  standing, supine and long sitting instruction      Knee/Hip Exercises: Standing   Lateral Step Up  Both;10 reps;Step Height: 4";Hand Hold: 0    Forward Step Up  Both;10 reps;Step Height: 4";Hand Hold: 0    Step Down  Both;10 reps;Step Height: 4";Hand Hold: 0    SLS with Vectors  5X3" each with 1 HHA      Knee/Hip Exercises: Seated   Sit to Sand  10 reps;without UE support      Knee/Hip Exercises: Supine   Bridges  10 reps    Straight  Leg Raises  Both;10 reps      Knee/Hip Exercises: Prone   Hip Extension  Both;10 reps             PT Education - 04/27/17 1605    Education provided  Yes    Education Details  reviewed HEP and goals per intial evaluation.  given copy of evaluation.  Educated on importance of being compliant with HEP    Person(s) Educated  Patient    Methods  Explanation;Demonstration;Tactile cues;Verbal cues;Handout    Comprehension  Verbalized understanding;Verbal cues required;Tactile cues required;Need further instruction;Returned demonstration       PT Short Term Goals - 04/25/17 1341      PT SHORT TERM GOAL #1   Title  PT to be able to sit for 45 minutes without increased pain in either hip     Time  3    Period  Weeks    Status  New    Target Date  05/16/17      PT SHORT TERM GOAL #2   Title  Pt to be able to walk  for 20 minutes without having increased pain in either hip.    Time  3    Period  Weeks    Status  New      PT SHORT TERM GOAL #3   Title  Pt to be able to balance on both LE for 45 seconds to reduce risk of falling.         PT Long Term Goals - 04/25/17 1344      PT LONG TERM GOAL #1   Title  Pt to be able to sit for 75 minutes without increased pain in either hip .    Time  6    Period  Weeks    Status  New    Target Date  06/06/17      PT LONG TERM GOAL #2   Title  PT to be able to walk for 40 mintues without having increase pain in either hip.     Time  6    Period  Weeks      PT LONG TERM GOAL #3   Title  Pt B LE strength to increase by one grade to allow pt to descend 10 steps without difficulty.      Period  Weeks    Status  New            Plan - 04/27/17 1636    Clinical Impression Statement  reviewed HEP and goals per initial evaluation.  Pt with poor recall and required verbal cues to complete in correct form.  Added exercises per PT POC as well as prone hip extensions all with good form and no pain voiced.  Pt most concerned with her  balance at this point.    Rehab Potential  Good    PT Frequency  3x / week    PT Duration  6 weeks    PT Treatment/Interventions  ADLs/Self Care Home Management;Therapeutic activities;Therapeutic exercise;Balance training;Neuromuscular re-education;Manual techniques    PT Next Visit Plan  Continue to progress towards goals.  Next session add SLS and tandem stance; progress to dynamic balance as able.     PT Home Exercise Plan  eval:  bridge, SLR, hamstring stretch.        Patient will benefit from skilled therapeutic intervention in order to improve the following deficits and impairments:  Decreased balance, Difficulty walking, Decreased strength, Pain  Visit Diagnosis: Muscle weakness (generalized)  Pain in right hip  Pain in left hip     Problem List Patient Active Problem List   Diagnosis Date Noted  . Rash and nonspecific skin eruption 12/18/2014  . History of bursitis 08/03/2014  . Post traumatic stress disorder (PTSD) 05/28/2014  . Mood swings 05/28/2014  . Migraines 05/28/2014   [name], PTA/CLT [phone]  [name] 04/27/2017, 4:43 PM  [facility] [address] [place], [place], [address] Phone: [phone]   Fax:  [phone]  Name: Emma Hicks MRN: [id number] Date of Birth: [birth year]

## 2017-04-29 ENCOUNTER — Encounter (HOSPITAL_COMMUNITY): Payer: Self-pay | Admitting: Physical Therapy

## 2017-04-29 ENCOUNTER — Ambulatory Visit (HOSPITAL_COMMUNITY): Payer: No Typology Code available for payment source | Admitting: Physical Therapy

## 2017-04-29 DIAGNOSIS — M6281 Muscle weakness (generalized): Secondary | ICD-10-CM

## 2017-04-29 DIAGNOSIS — M25551 Pain in right hip: Secondary | ICD-10-CM

## 2017-04-29 DIAGNOSIS — M25552 Pain in left hip: Secondary | ICD-10-CM

## 2017-04-29 NOTE — Patient Instructions (Addendum)
Iliotibial Band Stretch, Standing   Balance yourself by doing this against a wall  Stand, hands on hips, one leg crossed in front of other leg. Lean to same side as front leg until stretch is felt on other hip. Hold _30__ seconds. Change foot position and lean to same side. Hold _30__ seconds. Repeat __3_ times per session. Do __1_ sessions per day.  Copyright  VHI. All rights reserved.  Hip Hike    Stand on step, right leg off step, knee straight. Raise unsupported hip, keeping knee straight. Repeat _10___ times per set. Do _1___ sets per session. Do __2__ sessions per day.  http://orth.exer.us/692   Copyright  VHI. All rights reserved.  Step-Down / Step-Up    Stand on stair step or _6___ inch stool. Slowly bend left leg, lowering other foot to floor. Return by straightening front leg. Repeat _10___ times per set. Do __1__ sets per session. Do __2__ sessions per day.  http://orth.exer.us/684   Copyright  VHI. All rights reserved.

## 2017-04-29 NOTE — Therapy (Signed)
Education - 04/29/17 1410    Education provided  Yes    Education Details  New HEp    Person(s) Educated  Patient    Methods  Explanation;Demonstration;Handout    Comprehension  Verbalized understanding;Returned demonstration       PT Short Term Goals - 04/25/17 1341      PT SHORT TERM GOAL #1   Title  PT to be able to sit for 45 minutes without increased pain in either hip     Time  3    Period  Weeks    Status  New    Target Date  05/16/17      PT SHORT TERM GOAL #2   Title  Pt to be able to walk for 20 minutes without having increased pain in either hip.    Time  3    Period  Weeks    Status  New      PT SHORT TERM GOAL #3   Title  Pt to be able to balance on both LE for 45 seconds to reduce risk of falling.         PT Long Term Goals - 04/25/17 1344      PT LONG TERM GOAL #1   Title  Pt to be able to sit for 75 minutes without increased pain in either hip .    Time  6    Period  Weeks    Status  New    Target Date  06/06/17      PT LONG TERM GOAL #2   Title  PT to be able to walk for 40 mintues without having increase pain in either hip.     Time  6    Period  Weeks      PT LONG TERM GOAL #3   Title  Pt B LE strength to increase by one grade to allow pt to descend 10 steps without difficulty.       Period  Weeks    Status  New            Plan - 04/29/17 1410    Clinical Impression Statement  Added IT band stretch, hip excursions and sit to stand with minimal cuing needed for proper technique.      Rehab Potential  Good    PT Frequency  3x / week    PT Duration  6 weeks    PT Treatment/Interventions  ADLs/Self Care Home Management;Therapeutic activities;Therapeutic exercise;Balance training;Neuromuscular re-education;Manual techniques    PT Next Visit Plan  progress to dynamic balance as able.     PT Home Exercise Plan  eval:  bridge, SLR, hamstring stretch.        Patient will benefit from skilled therapeutic intervention in order to improve the following deficits and impairments:  Decreased balance, Difficulty walking, Decreased strength, Pain  Visit Diagnosis: Muscle weakness (generalized)  Pain in right hip  Pain in left hip     Problem List Patient Active Problem List   Diagnosis Date Noted  . Rash and nonspecific skin eruption 12/18/2014  . History of bursitis 08/03/2014  . Post traumatic stress disorder (PTSD) 05/28/2014  . Mood swings 05/28/2014  . Migraines 05/28/2014  Virgina Organynthia Gissela Bloch, PT CLT [phone] 04/29/2017, 2:28 PM   [facility] [address] [place], [place], [address] Phone: [phone]   Fax:  [phone]  Name: Presley RaddleMelody Stump MRN: [id number] Date of Birth: August 05, 1975  [facility] [address] [place], [place], [address] Phone: [phone]   Fax:  [phone]  Physical Therapy Treatment  Patient Details  Name: Presley RaddleMelody Mcfayden MRN: [id number] Date of Birth: 1975-10-15 Referring Provider: Fuller CanadaStanley Harrison   Encounter Date: 04/29/2017  PT End of Session - 04/29/17 1410    Visit Number  3    Number of Visits  18    Date for PT Re-Evaluation  05/25/17    Authorization Type  100% discount    Authorization - Visit Number  3    Authorization - Number of Visits  10    PT Start Time  1350    PT Stop Time  1430    PT Time Calculation (min)  40 min    Activity Tolerance  Patient tolerated treatment well    Behavior During Therapy  [facility] for tasks assessed/performed       Past Medical History:  Diagnosis Date  . Arthritis   . Depression   . Ulcer 2006    Past Surgical History:  Procedure Laterality Date  . DILATION AND CURETTAGE OF UTERUS  2002  . 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  Subjective Assessment - 04/29/17 1401    Subjective  Pt states that she did yoga yesterday and was sitting on the floor this morning untangling  Christmas tree lights and is very sore.      Pertinent History  Harrington Rod.  Hx of back and hip pain,     Limitations  Reading;Lifting;Standing;Walking;Sitting    How long can you sit comfortably?  Depends on the chair but on an average 30-40 minutes.     How long can you stand comfortably?  no problem     How long can you walk comfortably?  no probelm     Currently in Pain?  Yes    Pain Score  4     Pain Location  Hip    Pain Orientation  Right    Pain Descriptors / Indicators  Other (Comment) stiffness     Pain Onset  More than a month ago    Pain Frequency  Constant                      [facility] Adult PT Treatment/Exercise - 04/29/17 0001      Knee/Hip Exercises: Stretches   Passive Hamstring Stretch   Both;3 reps;30 seconds    ITB Stretch  3 reps;30 seconds    Other Knee/Hip Stretches  3 D hip excursions x 3       Knee/Hip Exercises: Aerobic   Nustep  5 minute warm up       Knee/Hip Exercises: Standing   Lateral Step Up  Both;10 reps;Step Height: step   Forward Step Up  Both;10 reps;Step Height: stair step   Step Down  --    Rocker Board  2 minutes    SLS with Vectors  3 x 10"  each with 1 HHA    Other Standing Knee Exercises  Tadem stance 30" x 1; tandem gait  Tandem stance with head turns x 10     Other Standing Knee Exercises  side step with t-band  x 2 RT       Knee/Hip Exercises: Seated   Sit to Sand  20 reps 10 on each              PT

## 2017-05-02 ENCOUNTER — Ambulatory Visit (HOSPITAL_COMMUNITY): Payer: No Typology Code available for payment source | Attending: Orthopedic Surgery | Admitting: Physical Therapy

## 2017-05-02 DIAGNOSIS — M25551 Pain in right hip: Secondary | ICD-10-CM | POA: Insufficient documentation

## 2017-05-02 DIAGNOSIS — R208 Other disturbances of skin sensation: Secondary | ICD-10-CM | POA: Insufficient documentation

## 2017-05-02 DIAGNOSIS — M25552 Pain in left hip: Secondary | ICD-10-CM | POA: Insufficient documentation

## 2017-05-02 DIAGNOSIS — M6281 Muscle weakness (generalized): Secondary | ICD-10-CM | POA: Insufficient documentation

## 2017-05-02 NOTE — Therapy (Signed)
[facility] [address] [place], [place], [address] Phone: [phone]   Fax:  [phone]  Physical Therapy Treatment  Patient Details  Name: Emma RaddleMelody Hicks MRN: [id number] Date of Birth: [birth year] Referring Provider: Fuller CanadaStanley Harrison   Encounter Date: 05/02/2017  PT End of Session - 05/02/17 1534    Visit Number  4    Number of Visits  18    Date for PT Re-Evaluation  05/25/17    Authorization Type  100% discount    Authorization - Visit Number  4    Authorization - Number of Visits  10    PT Start Time  1522    PT Stop Time  1600    PT Time Calculation (min)  38 min    Activity Tolerance  Patient tolerated treatment well    Behavior During Therapy  [facility] for tasks assessed/performed       Past Medical History:  Diagnosis Date  . Arthritis   . Depression   . Ulcer 2006    Past Surgical History:  Procedure Laterality Date  . DILATION AND CURETTAGE OF UTERUS  2002  . 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  Subjective Assessment - 05/02/17 1536    Subjective  Pt states she is doing well with a little pain in her Rt upper thigh.  Does Yoga on Thursday.                      [facility] Adult PT Treatment/Exercise - 05/02/17 0001      Knee/Hip Exercises: Stretches   Passive Hamstring Stretch  Both;3 reps;30 seconds;Limitations    Passive Hamstring Stretch Limitations  12" step    ITB Stretch  3 reps;30 seconds;Limitations    ITB Stretch Limitations  standing against wall     Other Knee/Hip Stretches  3 D hip excursions x10 reps      Knee/Hip Exercises: Standing   Forward Lunges  Both;10 reps;Limitations    Forward Lunges Limitations  onto BOSU, round side up,  no UE's    Lateral Step Up  Both;10 reps    Forward Step Up  Both;10 reps    SLS with Vectors  5 x 10"  each with 1 HHA    Other Standing Knee Exercises  static standing 1 minute on BOSU, flat side up    Other Standing Knee Exercises  side step with RTB 2 RT, tandem forward/retro/sidestepping on balance beam 2RT each               PT Short Term Goals - 04/25/17 1341      PT SHORT TERM GOAL #1   Title  PT to be able to sit for 45 minutes without increased pain in either hip     Time  3    Period  Weeks    Status  New    Target Date  05/16/17      PT SHORT TERM GOAL #2   Title  Pt to be able to walk for 20 minutes without having increased pain in either hip.    Time  3    Period  Weeks    Status  New      PT SHORT TERM GOAL #3   Title  Pt to be able to balance on both LE for 45 seconds to reduce risk of falling.         PT  Long Term Goals - 04/25/17 1344      PT LONG TERM GOAL #1   Title  Pt to be able to sit for 75 minutes without increased pain in either hip .    Time  6    Period  Weeks    Status  New    Target Date  06/06/17      PT LONG TERM GOAL #2   Title  PT to be able to walk for 40 mintues without having increase pain in either hip.     Time  6    Period  Weeks      PT LONG TERM GOAL #3   Title  Pt B LE strength to increase by one grade to allow pt to descend 10 steps without difficulty.      Period  Weeks    Status  New            Plan - 05/02/17 1534    Clinical Impression Statement  Continued with established stretches and progressed dynamic balance this session.  Pt able to complete with cues for form and encouragement.   Added balance beam wtih 2 self corrected LOB, otherwise no difficulty.  Began lunges onto BOSU and static balance.  No increased symptoms at EOS     Rehab Potential  Good    PT Frequency  3x / week    PT Duration  6 weeks    PT Treatment/Interventions  ADLs/Self Care Home Management;Therapeutic activities;Therapeutic exercise;Balance training;Neuromuscular re-education;Manual techniques    PT Next Visit Plan  Continue to decrease symptoms and progress functional strength and balance to meet all goals.  Next session begin  hurdles over balance beam.      PT Home Exercise Plan  eval:  bridge, SLR, hamstring stretch.        Patient will benefit from skilled therapeutic intervention in order to improve the following deficits and impairments:  Decreased balance, Difficulty walking, Decreased strength, Pain  Visit Diagnosis: Muscle weakness (generalized)  Pain in right hip  Pain in left hip     Problem List Patient Active Problem List   Diagnosis Date Noted  . Rash and nonspecific skin eruption 12/18/2014  . History of bursitis 08/03/2014  . Post traumatic stress disorder (PTSD) 05/28/2014  . Mood swings 05/28/2014  . Migraines 05/28/2014   Emma NidaAmy B Hicks, PTA/CLT [phone]  Emma NidaFrazier, Emma B 05/02/2017, 4:13 PM  [facility] [address] [place], [place], [address] Phone: [phone]   Fax:  [phone]  Name: Emma RaddleMelody Eastland MRN: [id number] Date of Birth: 1975/12/30

## 2017-05-04 ENCOUNTER — Ambulatory Visit (HOSPITAL_COMMUNITY): Payer: No Typology Code available for payment source | Admitting: Physical Therapy

## 2017-05-04 ENCOUNTER — Encounter (HOSPITAL_COMMUNITY): Payer: Self-pay

## 2017-05-05 ENCOUNTER — Encounter (HOSPITAL_COMMUNITY): Payer: Self-pay

## 2017-05-05 ENCOUNTER — Ambulatory Visit (HOSPITAL_COMMUNITY): Payer: No Typology Code available for payment source

## 2017-05-05 DIAGNOSIS — M25551 Pain in right hip: Secondary | ICD-10-CM

## 2017-05-05 DIAGNOSIS — M25552 Pain in left hip: Secondary | ICD-10-CM

## 2017-05-05 DIAGNOSIS — M6281 Muscle weakness (generalized): Secondary | ICD-10-CM

## 2017-05-05 NOTE — Therapy (Signed)
[facility] [address] [place], [place], [address] Phone: [phone]   Fax:  [phone]  Physical Therapy Treatment  Patient Details  Name: Emma Hicks MRN: [id number] Date of Birth: 1975-08-09 Referring Provider: Fuller Canada   Encounter Date: 05/05/2017  PT End of Session - 05/05/17 1655    Visit Number  5    Number of Visits  18    Date for PT Re-Evaluation  05/25/17    Authorization Type  100% discount    Authorization - Visit Number  5    Authorization - Number of Visits  10    PT Start Time  1650    PT Stop Time  1730    PT Time Calculation (min)  40 min    Activity Tolerance  Patient tolerated treatment well;No increased pain    Behavior During Therapy  WFL for tasks assessed/performed       Past Medical History:  Diagnosis Date  . Arthritis   . Depression   . Ulcer 2006    Past Surgical History:  Procedure Laterality Date  . DILATION AND CURETTAGE OF UTERUS  2002  . 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  Subjective Assessment - 05/05/17 1653    Subjective  Pt stated she woke up this morning with increased pain, did yoga for 35 minutes this afternoon and has soreness Lt thing.    Pertinent History  Harrington Rod.  Hx of back and hip pain,     Currently in Pain?  No/denies sore and stiffness                      [facility] Adult PT Treatment/Exercise - 05/05/17 0001      Knee/Hip Exercises: Stretches   Active Hamstring Stretch  Both;30 seconds;Limitations;3 reps    Piriformis Stretch  Both;2 reps;30 seconds 1 rep sitting 1 rep supine     Other Knee/Hip Stretches  3 D hip excursions x10 reps      Knee/Hip Exercises: Standing   Forward Lunges  Both;10 reps;Limitations    Forward Lunges Limitations  onto BOSU, round side up,  no UE's    Lateral Step Up  Both;10 reps;Hand Hold: 0;Step Height: 6"    Forward Step Up  Both;10 reps;Hand Hold: 0;Step  Height: 6" knee drive with opposite UE flexion and core set    Step Down  Both;10 reps;Hand Hold: 0;Step Height: 6"    SLS with Vectors  5 x 10"  each with 1 HHA on foam    Other Standing Knee Exercises  balance beam with 12in hurdles forward and sidestep               PT Short Term Goals - 04/25/17 1341      PT SHORT TERM GOAL #1   Title  PT to be able to sit for 45 minutes without increased pain in either hip     Time  3    Period  Weeks    Status  New    Target Date  05/16/17      PT SHORT TERM GOAL #2   Title  Pt to be able to walk for 20 minutes without having increased pain in either hip.    Time  3    Period  Weeks    Status  New      PT SHORT TERM GOAL #3   Title  Pt to be able to balance on both LE for 45 seconds to reduce risk of falling.         PT Long Term Goals - 04/25/17 1344      PT LONG TERM GOAL #1   Title  Pt to be able to sit for 75 minutes without increased pain in either hip .    Time  6    Period  Weeks    Status  New    Target Date  06/06/17      PT LONG TERM GOAL #2   Title  PT to be able to walk for 40 mintues without having increase pain in either hip.     Time  6    Period  Weeks      PT LONG TERM GOAL #3   Title  Pt B LE strength to increase by one grade to allow pt to descend 10 steps without difficulty.      Period  Weeks    Status  New            Plan - 05/05/17 1732    Clinical Impression Statement  Continued session focus on hip mobility with stretches, functional strengthening and dynamic balance.  Progressed step height with step down and increased demand with step ups including SlS activities.  Added hurdles to balance beam for dynamic balance training.  No reports of pain at EOS.      Rehab Potential  Good    PT Frequency  3x / week    PT Duration  6 weeks    PT Treatment/Interventions  ADLs/Self Care Home Management;Therapeutic activities;Therapeutic exercise;Balance training;Neuromuscular re-education;Manual  techniques    PT Next Visit Plan  Continue to decrease symptoms and progress functional strength and balance to meet all goals.      PT Home Exercise Plan  eval:  bridge, SLR, hamstring stretch.        Patient will benefit from skilled therapeutic intervention in order to improve the following deficits and impairments:  Decreased balance, Difficulty walking, Decreased strength, Pain  Visit Diagnosis: Muscle weakness (generalized)  Pain in right hip  Pain in left hip     Problem List Patient Active Problem List   Diagnosis Date Noted  . Rash and nonspecific skin eruption 12/18/2014  . History of bursitis 08/03/2014  . Post traumatic stress disorder (PTSD) 05/28/2014  . Mood swings 05/28/2014  . Migraines 05/28/2014   Becky Sax, LPTA; CBIS [phone]  Juel Burrow 05/05/2017, 5:49 PM  [facility] [address] [place], [place], [address] Phone: [phone]   Fax:  [phone]  Name: Emma Hicks MRN: [id number] Date of Birth: 1975-11-01

## 2017-05-06 ENCOUNTER — Ambulatory Visit (HOSPITAL_COMMUNITY): Payer: No Typology Code available for payment source

## 2017-05-06 ENCOUNTER — Telehealth (HOSPITAL_COMMUNITY): Payer: Self-pay | Admitting: Physician Assistant

## 2017-05-06 NOTE — Telephone Encounter (Signed)
05/06/17  pt left a message that she had been blowing leaf for the past 4 hours and just really sore and not coming in today

## 2017-05-09 ENCOUNTER — Ambulatory Visit (HOSPITAL_COMMUNITY): Payer: No Typology Code available for payment source | Admitting: Physical Therapy

## 2017-05-11 ENCOUNTER — Ambulatory Visit (HOSPITAL_COMMUNITY): Payer: No Typology Code available for payment source

## 2017-05-11 ENCOUNTER — Other Ambulatory Visit: Payer: Self-pay

## 2017-05-11 ENCOUNTER — Encounter (HOSPITAL_COMMUNITY): Payer: Self-pay

## 2017-05-11 DIAGNOSIS — M25551 Pain in right hip: Secondary | ICD-10-CM

## 2017-05-11 DIAGNOSIS — R208 Other disturbances of skin sensation: Secondary | ICD-10-CM

## 2017-05-11 DIAGNOSIS — M25552 Pain in left hip: Secondary | ICD-10-CM

## 2017-05-11 DIAGNOSIS — M6281 Muscle weakness (generalized): Secondary | ICD-10-CM

## 2017-05-11 NOTE — Therapy (Signed)
[facility] [address] , [place], [address] Phone: [phone]   Fax:  [phone]  Physical Therapy Treatment  Patient Details  Name: Emma RaddleMelody Frerking MRN: [id number] Date of Birth: July 07, 1975 Referring Provider: Fuller CanadaStanley Harrison   Encounter Date: 05/11/2017  PT End of Session - 05/11/17 1159    Visit Number  6    Number of Visits  18    Date for PT Re-Evaluation  05/25/17    Authorization Type  100% discount    Authorization - Visit Number  6    Authorization - Number of Visits  10    PT Start Time  1118    PT Stop Time  1200    PT Time Calculation (min)  42 min    Activity Tolerance  Patient tolerated treatment well;No increased pain    Behavior During Therapy  WFL for tasks assessed/performed       Past Medical History:  Diagnosis Date  . Arthritis   . Depression   . Ulcer 2006    Past Surgical History:  Procedure Laterality Date  . DILATION AND CURETTAGE OF UTERUS  2002  . 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  Subjective Assessment - 05/11/17 1124    Subjective  Patient was a 6/10 yesterday but reports today it is only a 3/10 today. She states she was shoveling a lot and has been sore from that. She didn't get to do her exercises for her HEP but feels like she got a lot of exercise through shoveling.     Pertinent History  Emma Hicks.  Hx of back and hip pain,     Currently in Pain?  Yes    Pain Score  3     Pain Location  Hip    Pain Orientation  Right;Left    Pain Descriptors / Indicators  Tightness;Aching stiff    Pain Type  Chronic pain    Pain Onset  More than a month ago    Pain Frequency  Constant       [facility] Adult PT Treatment/Exercise - 05/11/17 0001      Knee/Hip Exercises: Stretches   Active Hamstring Stretch  Both;30 seconds;Limitations;3 reps    Piriformis Stretch  Both;30 seconds;3 reps;Limitations    Piriformis Stretch Limitations   seated    Other Knee/Hip Stretches  --      Knee/Hip Exercises: Standing   Forward Lunges  Both;10 reps;Limitations;2 sets    Forward Lunges Limitations  onto BOSU, round side up,  intermittent UE support    Lateral Step Up  Both;Hand Hold: 0;15 reps;Step Height: 8";Limitations    Lateral Step Up Limitations  with SLS on step up/contralateral knee drive    Forward Step Up  Both;Hand Hold: 0;Step Height: 6";15 reps;Limitations    Forward Step Up Limitations  with SLS on step up/contralateral knee drive    Functional Squat  --    Other Standing Knee Exercises  SLS on foam with 3 cone taps, 10 reps BLE, intermittent UE support    Other Standing Knee Exercises  balance beam with 12in hurdles forward and sidestep, 6 times each direction       PT Education - 05/11/17 1156    Education provided  Yes    Education Details  Educated on form and weight shift during balance and fucntional strengthening exercises.     Person(s) Educated  Patient    Methods  Explanation    Comprehension  Verbalized understanding       PT Short Term Goals - 04/25/17 1341      PT SHORT TERM GOAL #1   Title  PT to be able to sit for 45 minutes without increased pain in either hip     Time  3    Period  Weeks    Status  New    Target Date  05/16/17      PT SHORT TERM GOAL #2   Title  Pt to be able to walk for 20 minutes without having increased pain in either hip.    Time  3    Period  Weeks    Status  New      PT SHORT TERM GOAL #3   Title  Pt to be able to balance on both LE for 45 seconds to reduce risk of falling.         PT Long Term Goals - 04/25/17 1344      PT LONG TERM GOAL #1   Title  Pt to be able to sit for 75 minutes without increased pain in either hip .    Time  6    Period  Weeks    Status  New    Target Date  06/06/17      PT LONG TERM GOAL #2   Title  PT to be able to walk for 40 mintues without having increase pain in either hip.     Time  6    Period  Weeks      PT LONG  TERM GOAL #3   Title  Pt B LE strength to increase by one grade to allow pt to descend 10 steps without difficulty.      Period  Weeks    Status  New        Plan - 05/11/17 1230    Clinical Impression Statement  Patient is progressing well in therapy. She has been able to advance functional strengthening and balance activities while maintaining proper form and technique. She continues to be limited with dynamic and static balance activities in SLS and continues to rely on postural trunk reactions more than ankle and hip stabilizers. She will continue to benefit from skilled PT services to address current impairments to progress towards goals and improve QOL through mobility.    Rehab Potential  Good    PT Frequency  3x / week    PT Duration  6 weeks    PT Treatment/Interventions  ADLs/Self Care Home Management;Therapeutic activities;Therapeutic exercise;Balance training;Neuromuscular re-education;Manual techniques    PT Next Visit Plan  Continue to decrease symptoms and progress functional strength and balance to meet all goals.  Continue with balance/functional training and update HEP.    PT Home Exercise Plan  eval:  bridge, SLR, hamstring stretch.     Consulted and Agree with Plan of Care  Patient       Patient will benefit from skilled therapeutic intervention in order to improve the following deficits and impairments:  Decreased balance, Difficulty walking, Decreased strength, Pain  Visit Diagnosis: Muscle weakness (generalized)  Pain in right hip  Pain in left hip  Other disturbances of skin sensation     Problem List Patient Active Problem List   Diagnosis Date Noted  . Rash and nonspecific skin eruption 12/18/2014  . History of bursitis 08/03/2014  . Post traumatic stress disorder (PTSD) 05/28/2014  . Mood swings 05/28/2014  . Migraines 05/28/2014  Emma Hicks, PT, DPT Physical Therapist with [facility]  05/11/2017 12:38  PM    [facility] [address] [place], [place], [address] Phone: [phone]   Fax:  [phone]  Name: Emma Hicks MRN: [id number] Date of Birth: [birth year]

## 2017-05-13 ENCOUNTER — Ambulatory Visit (HOSPITAL_COMMUNITY): Payer: No Typology Code available for payment source | Admitting: Physical Therapy

## 2017-05-13 ENCOUNTER — Encounter (HOSPITAL_COMMUNITY): Payer: Self-pay | Admitting: Physical Therapy

## 2017-05-13 DIAGNOSIS — M6281 Muscle weakness (generalized): Secondary | ICD-10-CM

## 2017-05-13 DIAGNOSIS — M25552 Pain in left hip: Secondary | ICD-10-CM

## 2017-05-13 DIAGNOSIS — M25551 Pain in right hip: Secondary | ICD-10-CM

## 2017-05-13 NOTE — Therapy (Signed)
[facility] [address] [place], [place], [address] Phone: [phone]   Fax:  [phone]  Physical Therapy Treatment  Patient Details  Name: Emma Hicks MRN: [id number] Date of Birth: July 08, 1975 Referring Provider: Fuller CanadaStanley Harrison   Encounter Date: 05/13/2017  PT End of Session - 05/13/17 1429    Visit Number  7    Number of Visits  18    Date for PT Re-Evaluation  05/25/17    Authorization Type  100% discount    Authorization - Visit Number  7    Authorization - Number of Visits  10    PT Start Time  1345    PT Stop Time  1428    PT Time Calculation (min)  43 min    Activity Tolerance  Patient tolerated treatment well;No increased pain    Behavior During Therapy  WFL for tasks assessed/performed       Past Medical History:  Diagnosis Date  . Arthritis   . Depression   . Ulcer 2006    Past Surgical History:  Procedure Laterality Date  . DILATION AND CURETTAGE OF UTERUS  2002  . 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  Subjective Assessment - 05/13/17 1350    Subjective  Pt states that her hip feels okay.  Her legs were really sore after the last treatment.      Pertinent History  Harrington Rod.  Hx of back and hip pain,     Currently in Pain?  Yes    Pain Score  2     Pain Location  Hip    Pain Orientation  Right    Pain Descriptors / Indicators  Sore    Pain Type  Chronic pain    Pain Onset  More than a month ago                      [facility] - 05/13/17 0001      Knee/Hip Exercises: Standing   Forward Lunges  Both;1 set;15 reps;Limitations    Forward Lunges Limitations  onto BOSU, round side up,    Lateral Step Up  Both;10 reps;Step Height: 6"    Lateral Step Up Limitations  3 # elbow flexion with step up     Forward Step Up  Both;15 reps;Step Height: 8";Limitations    Forward Step Up Limitations  3# dumbell shoulder  flexion as you step up     Step Down  Both;10 reps;Step Height: 6";Limitations    Step Down Limitations  arms up with step down with 3#     SLS with Vectors  15" x 3     Other Standing Knee Exercises  SLS on foam with 3 cone taps, 10 reps BLE, intermittent UE support    Other Standing Knee Exercises  PALVO green x10      Knee/Hip Exercises: Seated   Sit to Sand  20 reps               PT Short Term Goals - 04/25/17 1341      PT SHORT TERM GOAL #1   Title  PT to be able to sit for 45 minutes without increased pain in either hip     Time  3    Period  Weeks    Status  New    Target Date  05/16/17      PT  SHORT TERM GOAL #2   Title  Pt to be able to walk for 20 minutes without having increased pain in either hip.    Time  3    Period  Weeks    Status  New      PT SHORT TERM GOAL #3   Title  Pt to be able to balance on both LE for 45 seconds to reduce risk of falling.         PT Long Term Goals - 04/25/17 1344      PT LONG TERM GOAL #1   Title  Pt to be able to sit for 75 minutes without increased pain in either hip .    Time  6    Period  Weeks    Status  New    Target Date  06/06/17      PT LONG TERM GOAL #2   Title  PT to be able to walk for 40 mintues without having increase pain in either hip.     Time  6    Period  Weeks      PT LONG TERM GOAL #3   Title  Pt B LE strength to increase by one grade to allow pt to descend 10 steps without difficulty.      Period  Weeks    Status  New            Plan - 05/13/17 1451    Clinical Impression Statement  Advanced pt core by using weights in UE.  Pt improving in all aspects but still needing minimal cuing for technique.      Rehab Potential  Good    PT Frequency  3x / week    PT Duration  6 weeks    PT Treatment/Interventions  ADLs/Self Care Home Management;Therapeutic activities;Therapeutic exercise;Balance training;Neuromuscular re-education;Manual techniques    PT Next Visit Plan  begin sidestepping  over cones on balance beam.      PT Home Exercise Plan  eval:  bridge, SLR, hamstring stretch.     Consulted and Agree with Plan of Care  Patient       Patient will benefit from skilled therapeutic intervention in order to improve the following deficits and impairments:  Decreased balance, Difficulty walking, Decreased strength, Pain  Visit Diagnosis: Muscle weakness (generalized)  Pain in right hip  Pain in left hip     Problem List Patient Active Problem List   Diagnosis Date Noted  . Rash and nonspecific skin eruption 12/18/2014  . History of bursitis 08/03/2014  . Post traumatic stress disorder (PTSD) 05/28/2014  . Mood swings 05/28/2014  . Migraines 05/28/2014    Virgina Organynthia Russell, PT CLT [phone] 05/13/2017, 2:53 PM  [facility] [address] [place], [place], [address] Phone: [phone]   Fax:  [phone]  Name: Emma Hicks MRN: [id number] Date of Birth: [birth year]

## 2017-05-16 ENCOUNTER — Ambulatory Visit (HOSPITAL_COMMUNITY): Payer: No Typology Code available for payment source | Admitting: Physical Therapy

## 2017-05-16 DIAGNOSIS — M25552 Pain in left hip: Secondary | ICD-10-CM

## 2017-05-16 DIAGNOSIS — M6281 Muscle weakness (generalized): Secondary | ICD-10-CM

## 2017-05-16 DIAGNOSIS — M25551 Pain in right hip: Secondary | ICD-10-CM

## 2017-05-16 NOTE — Therapy (Signed)
[facility] [address] [place], [place], [address] Phone: [phone]   Fax:  [phone]  Physical Therapy Treatment  Patient Details  Name: Emma Hicks MRN: [id number] Date of Birth: [birth year] Referring Provider: Fuller CanadaStanley Harrison   Encounter Date: 05/16/2017  PT End of Session - 05/16/17 1445    Visit Number  8    Number of Visits  18    Date for PT Re-Evaluation  05/25/17    Authorization Type  100% discount    Authorization - Visit Number  8    Authorization - Number of Visits  10    PT Start Time  1355    PT Stop Time  1440    PT Time Calculation (min)  45 min    Activity Tolerance  Patient tolerated treatment well;No increased pain    Behavior During Therapy  WFL for tasks assessed/performed       Past Medical History:  Diagnosis Date  . Arthritis   . Depression   . Ulcer 2006    Past Surgical History:  Procedure Laterality Date  . DILATION AND CURETTAGE OF UTERUS  2002  . 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  Subjective Assessment - 05/16/17 1451    Subjective  Pt reports no pain, just stiffness and soreness in her legs.  states she is usually sore the next day after therapy then it dissipates.  States she is walking to her next appointment, approx 15 minute walk.  States she is walking everyday.    Currently in Pain?  No/denies                      [facility]/Exercise - 05/16/17 0001      Balance Poses: Yoga   Warrior I  2 reps;30 seconds    Warrior II  2 reps;30 seconds      Knee/Hip Exercises: Standing   Forward Lunges  Both;1 set;15 reps;Limitations    Forward Lunges Limitations  onto BOSU, round side up,  intermittent UE support    Lateral Step Up  Both;10 reps;Step Height: 6"    Lateral Step Up Limitations  3 # elbow flexion with step up     Forward Step Up  Both;15 reps;Step Height: 8";Limitations    Forward Step Up  Limitations  3# dumbell shoulder flexion as you step up     Step Down  Both;10 reps;Step Height: 6";Limitations    Step Down Limitations  3# dumbell with UE abduction with step down    SLS with Vectors  15" x 3 on foam no UE    Other Standing Knee Exercises  balance beam with hurdles side stepping and forward 2RT each    Other Standing Knee Exercises  Palov on foam with tandem green x10      Knee/Hip Exercises: Seated   Sit to Sand  20 reps with 2# dumbells UE flexion Lt back 10 reps, Rt back 10 reps               PT Short Term Goals - 04/25/17 1341      PT SHORT TERM GOAL #1   Title  PT to be able to sit for 45 minutes without increased pain in either hip     Time  3    Period  Weeks    Status  New    Target Date  05/16/17  PT SHORT TERM GOAL #2   Title  Pt to be able to walk for 20 minutes without having increased pain in either hip.    Time  3    Period  Weeks    Status  New      PT SHORT TERM GOAL #3   Title  Pt to be able to balance on both LE for 45 seconds to reduce risk of falling.         PT Long Term Goals - 04/25/17 1344      PT LONG TERM GOAL #1   Title  Pt to be able to sit for 75 minutes without increased pain in either hip .    Time  6    Period  Weeks    Status  New    Target Date  06/06/17      PT LONG TERM GOAL #2   Title  PT to be able to walk for 40 mintues without having increase pain in either hip.     Time  6    Period  Weeks      PT LONG TERM GOAL #3   Title  Pt B LE strength to increase by one grade to allow pt to descend 10 steps without difficulty.      Period  Weeks    Status  New            Plan - 05/16/17 1448    Clinical Impression Statement  continued with core stability therex.  Palov press continues to be challenging, however balance beam no longer posing difficulty.  Added Warrior I/II this session to increase stability challenge with noted fatigue in LE's.   Also began 2# abduction with step downs.  Pt is  overall progressing well and continues to ambulate daily    Rehab Potential  Good    PT Frequency  3x / week    PT Duration  6 weeks    PT Treatment/Interventions  ADLs/Self Care Home Management;Therapeutic activities;Therapeutic exercise;Balance training;Neuromuscular re-education;Manual techniques    PT Next Visit Plan  Continue to progress stability.  Attempt quadruped UE/LE for further core stabiltiy.  Add Warrior III.    PT Home Exercise Plan  eval:  bridge, SLR, hamstring stretch.     Consulted and Agree with Plan of Care  Patient       Patient will benefit from skilled therapeutic intervention in order to improve the following deficits and impairments:  Decreased balance, Difficulty walking, Decreased strength, Pain  Visit Diagnosis: Muscle weakness (generalized)  Pain in right hip  Pain in left hip     Problem List Patient Active Problem List   Diagnosis Date Noted  . Rash and nonspecific skin eruption 12/18/2014  . History of bursitis 08/03/2014  . Post traumatic stress disorder (PTSD) 05/28/2014  . Mood swings 05/28/2014  . Migraines 05/28/2014   Lurena NidaAmy B Frazier, PTA/CLT [phone]  Lurena NidaFrazier, Amy B 05/16/2017, 2:53 PM  [facility] [address] [place], [place], [address] Phone: [phone]   Fax:  [phone]  Name: Emma Hicks MRN: [id number] Date of Birth: 04-Jan-1976

## 2017-05-18 ENCOUNTER — Ambulatory Visit (HOSPITAL_COMMUNITY): Payer: No Typology Code available for payment source | Admitting: Physical Therapy

## 2017-05-18 DIAGNOSIS — M6281 Muscle weakness (generalized): Secondary | ICD-10-CM

## 2017-05-18 DIAGNOSIS — M25552 Pain in left hip: Secondary | ICD-10-CM

## 2017-05-18 DIAGNOSIS — M25551 Pain in right hip: Secondary | ICD-10-CM

## 2017-05-18 NOTE — Therapy (Signed)
3    Period  Weeks    Status  New    Target Date  05/16/17      PT SHORT TERM GOAL #2   Title  Pt to be able to walk for 20 minutes without having increased pain in either hip.    Time  3    Period  Weeks    Status  New      PT SHORT TERM GOAL #3   Title  Pt to be able to balance on both LE for 45 seconds to reduce risk of falling.         PT Long Term Goals - 04/25/17 1344      PT LONG TERM GOAL #1   Title  Pt to be able to sit for 75 minutes without increased pain in either hip .    Time  6    Period  Weeks    Status  New    Target Date  06/06/17      PT LONG TERM GOAL #2   Title  PT to be able to walk for 40 mintues without having increase pain in either hip.     Time  6    Period  Weeks      PT LONG TERM GOAL #3   Title  Pt B LE strength to increase by one grade to allow pt to descend 10 steps without difficulty.      Period  Weeks    Status  New            Plan - 05/18/17 1206    Clinical Impression Statement  continued exercise progression.  Increased challenge of turning over BOSU for lunges, however patient reported softer side was more challenging for her ankle (hard  side for hips).  Instructed with ITB stretch in standing and sidelying and added ankle eversion with GTB as with noted lateral instability in ankle.  Also began lateral squats on BOSU with good challenge noted.  Pt without any increased pain or issues at EOS.    Rehab Potential  Good    PT Frequency  3x / week    PT Duration  6 weeks    PT Treatment/Interventions  ADLs/Self Care Home Management;Therapeutic activities;Therapeutic exercise;Balance training;Neuromuscular re-education;Manual techniques    PT Next Visit Plan  Continue to progress stability.    Next session begin squats on BOSU and add warrior III, quadruped ex for core.    PT Home Exercise Plan  eval:  bridge, SLR, hamstring stretch.     Consulted and Agree with Plan of Care  Patient       Patient will benefit from skilled therapeutic intervention in order to improve the following deficits and impairments:  Decreased balance, Difficulty walking, Decreased strength, Pain  Visit Diagnosis: Muscle weakness (generalized)  Pain in right hip  Pain in left hip     Problem List Patient Active Problem List   Diagnosis Date Noted  . Rash and nonspecific skin eruption 12/18/2014  . History of bursitis 08/03/2014  . Post traumatic stress disorder (PTSD) 05/28/2014  . Mood swings 05/28/2014  . Migraines 05/28/2014   Lurena NidaAmy B Markita Stcharles, PTA/CLT [phone]  Lurena NidaFrazier, Hersel Mcmeen B 05/18/2017, 12:10 PM  [facility] [address] [place], [place], [address] Phone: [phone]   Fax:  [phone]  Name: Presley RaddleMelody Sramek MRN: [id number] Date of Birth: 1975/10/03  [facility] [address] [place], [place], [address] Phone: [phone]   Fax:  [phone]  Physical Therapy Treatment  Patient Details  Name: Presley RaddleMelody Sekula MRN: [id number] Date of Birth: 02-20-1976 Referring Provider: Fuller CanadaStanley Harrison   Encounter Date: 05/18/2017  PT End of Session - 05/18/17 1210    Visit Number  9    Number of Visits  18    Date for PT Re-Evaluation  05/25/17    Authorization Type  100% discount    Authorization - Visit Number  9    Authorization - Number of Visits  10    PT Start Time  1121    PT Stop Time  1204    PT Time Calculation (min)  43 min    Activity Tolerance  Patient tolerated treatment well;No increased pain    Behavior During Therapy  WFL for tasks assessed/performed       Past Medical History:  Diagnosis Date  . Arthritis   . Depression   . Ulcer 2006    Past Surgical History:  Procedure Laterality Date  . DILATION AND CURETTAGE OF UTERUS  2002  . 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  Subjective Assessment - 05/18/17 1122    Subjective  PT states she only has Rt hip discomfort with certain positions.  STates she has some Rt lateral ankle stiffness/soreness but no real pain.  States she walked 20 minutes to get to therapy today.     Currently in Pain?  No/denies                      [facility]/Exercise - 05/18/17 0001      Balance Poses: Yoga   Warrior I  2 reps;30 seconds    Warrior II  2 reps;30 seconds      Knee/Hip Exercises: Standing   Forward Lunges  Both;1 set;15 reps;Limitations    Forward Lunges Limitations  onto BOSU, round side up,  intermittent UE support    Side Lunges  Both;1 set;15 reps;Limitations    Side Lunges Limitations  onto BOSU, round side up    Lateral Step Up  Both;10 reps;Step Height: 6"    Lateral Step Up Limitations  3 # elbow flexion with step up     Forward Step Up  Both;15  reps;Step Height: 8";Limitations    Forward Step Up Limitations  3# dumbell shoulder flexion as you step up     Step Down  Both;10 reps;Step Height: 6";Limitations    Step Down Limitations  2# dumbell with UE abduction with step down    Functional Squat  --    Functional Squat Limitations  --    SLS with Vectors  15" x 3 on foam no UE    Other Standing Knee Exercises  Palov on foam with tandem green x15      Knee/Hip Exercises: Seated   Other Seated Knee/Hip Exercises  GTB ankle eversion 10 reps      Knee/Hip Exercises: Sidelying   Other Sidelying Knee/Hip Exercises  ITB stretch supine and standing 2X30"               PT Short Term Goals - 04/25/17 1341      PT SHORT TERM GOAL #1   Title  PT to be able to sit for 45 minutes without increased pain in either hip     Time

## 2017-05-20 ENCOUNTER — Encounter (HOSPITAL_COMMUNITY): Payer: Self-pay

## 2017-05-20 ENCOUNTER — Other Ambulatory Visit: Payer: Self-pay

## 2017-05-20 ENCOUNTER — Ambulatory Visit (HOSPITAL_COMMUNITY): Payer: No Typology Code available for payment source

## 2017-05-20 DIAGNOSIS — M25552 Pain in left hip: Secondary | ICD-10-CM

## 2017-05-20 DIAGNOSIS — M25551 Pain in right hip: Secondary | ICD-10-CM

## 2017-05-20 DIAGNOSIS — M6281 Muscle weakness (generalized): Secondary | ICD-10-CM

## 2017-05-20 NOTE — Therapy (Signed)
[facility] [address] [place], [place], [address] Phone: [phone]   Fax:  [phone]  Physical Therapy Treatment  Patient Details  Name: Emma RaddleMelody Hicks MRN: [id number] Date of Birth: 1976/02/13 Referring Provider: Fuller CanadaStanley Harrison   Encounter Date: 05/20/2017  PT End of Session - 05/20/17 1343    Visit Number  10    Number of Visits  18    Date for PT Re-Evaluation  05/25/17    Authorization Type  100% discount    Authorization - Visit Number  10    Authorization - Number of Visits  18    PT Start Time  1345    Activity Tolerance  Patient tolerated treatment well;No increased pain    Behavior During Therapy  WFL for tasks assessed/performed       Past Medical History:  Diagnosis Date  . Arthritis   . Depression   . Ulcer 2006    Past Surgical History:  Procedure Laterality Date  . DILATION AND CURETTAGE OF UTERUS  2002  . 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  Subjective Assessment - 05/20/17 1343    Subjective  Pt states that she's sore today because she did a lot of squats at her other PT ([employer]).     Currently in Pain?  No/denies          [facility] - [facility]17 0001      Balance Poses: Yoga   Warrior I  2 reps;30 seconds    Warrior III  2 reps;30 seconds      Knee/Hip Exercises: Web designertretches   Hip Flexor Stretch  Both;2 reps;30 seconds    Hip Flexor Stretch Limitations  prone with rope    Other Knee/Hip Stretches  standing ITB stretch 2x30" BLE      Knee/Hip Exercises: Standing   Forward Lunges  Both;20 reps    Forward Lunges Limitations  onto BOSU, round side up,  intermittent UE support    Side Lunges  Both;20 reps    Side Lunges Limitations  onto BOSU, round side up    Functional Squat  10 reps    Functional Squat Limitations  on BOSU round side down    Other Standing Knee Exercises  deadlifts from 8" step 2x10 with 10# DB       Knee/Hip Exercises: Prone   Other Prone Exercises  physioball roll outs from knees 3x5 reps (cues for form)    Other Prone Exercises  birddogs x15 reps          PT Short Term Goals - 04/25/17 1341      PT SHORT TERM GOAL #1   Title  PT to be able to sit for 45 minutes without increased pain in either hip     Time  3    Period  Weeks    Status  New    Target Date  05/16/17      PT SHORT TERM GOAL #2   Title  Pt to be able to walk for 20 minutes without having increased pain in either hip.    Time  3    Period  Weeks    Status  New      PT SHORT TERM GOAL #3   Title  Pt to be able to balance on both LE for 45 seconds to reduce risk of falling.  PT Long Term Goals - 04/25/17 1344      PT LONG TERM GOAL #1   Title  Pt to be able to sit for 75 minutes without increased pain in either hip .    Time  6    Period  Weeks    Status  New    Target Date  06/06/17      PT LONG TERM GOAL #2   Title  PT to be able to walk for 40 mintues without having increase pain in either hip.     Time  6    Period  Weeks      PT LONG TERM GOAL #3   Title  Pt B LE strength to increase by one grade to allow pt to descend 10 steps without difficulty.      Period  Weeks    Status  New            Plan - 05/20/17 1415    Clinical Impression Statement  Continued with established POC. Added prone quad/hip flexor stretch with rope this date and pt tolerated great, reported decreased anterior hip stiffness following. Also introduced pt to birddogs and prone physioball rollouts this date per POC. Continue to progress as tolerated.    Rehab Potential  Good    PT Frequency  3x / week    PT Duration  6 weeks    PT Treatment/Interventions  ADLs/Self Care Home Management;Therapeutic activities;Therapeutic exercise;Balance training;Neuromuscular re-education;Manual techniques    PT Next Visit Plan  Continue to progress stability. continue squats on BOSU, warrior III, and quadruped ex  for core; add step ups on BOSU (round side up) with OH press    PT Home Exercise Plan  eval:  bridge, SLR, hamstring stretch.     Consulted and Agree with Plan of Care  Patient       Patient will benefit from skilled therapeutic intervention in order to improve the following deficits and impairments:  Decreased balance, Difficulty walking, Decreased strength, Pain  Visit Diagnosis: Muscle weakness (generalized)  Pain in right hip  Pain in left hip     Problem List Patient Active Problem List   Diagnosis Date Noted  . Rash and nonspecific skin eruption 12/18/2014  . History of bursitis 08/03/2014  . Post traumatic stress disorder (PTSD) 05/28/2014  . Mood swings 05/28/2014  . Migraines 05/28/2014       Jac CanavanBrooke Powell PT, DPT  [place] [facility] [address] [place], [place], [address] Phone: [phone]   Fax:  [phone]  Name: Emma RaddleMelody Hicks MRN: [id number] Date of Birth: 11-Aug-1975

## 2017-05-23 ENCOUNTER — Encounter (HOSPITAL_COMMUNITY): Payer: Self-pay | Admitting: Physical Therapy

## 2017-05-23 ENCOUNTER — Ambulatory Visit (HOSPITAL_COMMUNITY): Payer: No Typology Code available for payment source | Admitting: Physical Therapy

## 2017-05-23 DIAGNOSIS — M6281 Muscle weakness (generalized): Secondary | ICD-10-CM

## 2017-05-23 DIAGNOSIS — M25551 Pain in right hip: Secondary | ICD-10-CM

## 2017-05-23 DIAGNOSIS — M25552 Pain in left hip: Secondary | ICD-10-CM

## 2017-05-23 NOTE — Therapy (Addendum)
[place], [place], [address] Phone: [phone]   Fax:  [phone]  Physical Therapy Treatment  Patient Details  Name: Emma Hicks MRN: [id number] Date of Birth: [birth year] Referring Provider: Arther Abbott    Encounter Date: 05/23/2017  PT End of Session - 05/23/17 1142    Visit Number  11    Number of Visits  11    Date for PT Re-Evaluation  05/25/17    Authorization Type  100% discount    Authorization - Visit Number  10    Authorization - Number of Visits  11    PT Start Time  2947 Pt late    PT Stop Time  1145    PT Time Calculation (min)  30 min    Activity Tolerance  Patient tolerated treatment well;No increased pain    Behavior During Therapy  WFL for tasks assessed/performed       Past Medical History:  Diagnosis Date  . Arthritis   . Depression   . Ulcer 2006    Past Surgical History:  Procedure Laterality Date  . DILATION AND CURETTAGE OF UTERUS  2002  . 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  Subjective Assessment - 05/23/17 1125    Subjective  Pt states that she has been sore in her thighs.  She is going up and down steps which are easier now.      Pertinent History  Harrington Rod.  Hx of back and hip pain,     How long can you sit comfortably?  no probelm    How long can you stand comfortably?  able to stand for 10 minutes     How long can you walk comfortably?  3 hours     Currently in Pain?  Yes    Pain Score  6     Pain Location  Leg    Pain Orientation  Upper;Right;Left    Pain Descriptors / Indicators  Sore    Pain Type  Chronic pain    Pain Onset  More than a month ago    Pain Frequency  Intermittent         [facility] PT Assessment - 05/23/17 0001      Assessment   Medical Diagnosis  B hip tendonitis    Referring Provider  Arther Abbott     Onset Date/Surgical Date  03/31/17    Next MD Visit  none    Prior  Therapy  none      Precautions   Precautions  None      Restrictions   Weight Bearing Restrictions  No      Balance Screen   Has the patient fallen in the past 6 months  No    Has the patient had a decrease in activity level because of a fear of falling?   No    Is the patient reluctant to leave their home because of a fear of falling?   No      Home Environment   Living Environment  Private residence    Type of [place] Access  Stairs to enter more pain going up than down     Entrance Stairs-Number of Steps  10    Home Layout  Two level;Bed/bath upstairs      Prior Function   Level of Independence  Independent    Vocation  Unemployed    Leisure  walk       Cognition   Overall Cognitive Status  Within Functional Limits for tasks assessed      Observation/Other Assessments   Focus on Therapeutic Outcomes (FOTO)   60      Functional Tests   Functional tests  Single leg stance;Sit to Stand      Single Leg Stance   Comments  LT: 47 was  20;  Rt: 30 was  39      Sit to Stand   Comments   5  x  9.71 was 9l.76       AROM   Right Hip Flexion  130    Right Hip External Rotation   70    Right Hip Internal Rotation   50    Left Hip Flexion  130    Left Hip External Rotation   55    Left Hip Internal Rotation   53      Strength   Right Hip Flexion  5/5 was 3-/5    Right Hip Extension  5/5 was 3/5     Right Hip ABduction  5/5    Left Hip Flexion  5/5 was 3-/5     Left Hip Extension  5/5 was 3/5    Left Hip ABduction  5/5    Right Knee Flexion  5/5 was 3+/5     Right Knee Extension  5/5    Left Knee Flexion  5/5 was 4-/5    Left Knee Extension  5/5      Flexibility   Soft Tissue Assessment /Muscle Length  yes    Hamstrings  RT 130;  Lt 145                  [facility] Adult PT Treatment/Exercise - 05/23/17 0001      Knee/Hip Exercises: Standing   SLS  x4 B     Other Standing Knee Exercises  Tandem stance B  x 30       Knee/Hip Exercises: Seated    Sit to Sand  10 reps             PT Education - 05/23/17 1141    Education provided  Yes    Education Details  The importance of keepin up the balance exercises; the benefit of going to the [employer]) Educated  Patient    Methods  Explanation    Comprehension  Verbalized understanding       PT Short Term Goals - 05/23/17 1136      PT SHORT TERM GOAL #1   Title  PT to be able to sit for 45 minutes without increased pain in either hip     Time  3    Period  Weeks    Status  Achieved      PT SHORT TERM GOAL #2   Title  Pt to be able to walk for 20 minutes without having increased pain in either hip.    Time  3    Period  Weeks    Status  Achieved      PT SHORT TERM GOAL #3   Title  Pt to be able to balance on both LE for 45 seconds to reduce risk of falling.     Status  On-going        PT Long Term Goals - 05/23/17 1136      PT LONG TERM GOAL #1  Title  Pt to be able to sit for 75 minutes without increased pain in either hip .    Time  6    Period  Weeks    Status  Achieved      PT LONG TERM GOAL #2   Title  PT to be able to walk for 40 mintues without having increase pain in either hip.     Time  6    Period  Weeks    Status  Achieved      PT LONG TERM GOAL #3   Title  Pt B LE strength to increase by one grade to allow pt to descend 10 steps without difficulty.      Period  Weeks    Status  Achieved            Plan - 05/23/17 1143    Clinical Impression Statement  Pt reevaluated.  Pt still has mm soreness but therapist assured her that this is normal.  Pt has improved significantly in ther mm strength and activity tolerance although she needs to continue her balance exercises.  PT verbalized understanidng of this.      Rehab Potential  Good    PT Frequency  3x / week    PT Duration  6 weeks    PT Treatment/Interventions  ADLs/Self Care Home Management;Therapeutic activities;Therapeutic exercise;Balance training;Neuromuscular  re-education;Manual techniques    PT Next Visit Plan  Discharge.     PT Home Exercise Plan  eval:  bridge, SLR, hamstring stretch.     Consulted and Agree with Plan of Care  Patient       Patient will benefit from skilled therapeutic intervention in order to improve the following deficits and impairments:  Decreased balance, Difficulty walking, Decreased strength, Pain  Visit Diagnosis: Muscle weakness (generalized)  Pain in right hip  Pain in left hip     Problem List Patient Active Problem List   Diagnosis Date Noted  . Rash and nonspecific skin eruption 12/18/2014  . History of bursitis 08/03/2014  . Post traumatic stress disorder (PTSD) 05/28/2014  . Mood swings 05/28/2014  . Migraines 05/28/2014    Rayetta Humphrey, PT CLT [phone] 05/23/2017, 11:45 AM  [place] [address] [place], [place], [address] Phone: [phone]   Fax:  [phone]  Name: Emma Hicks MRN: [id number] Date of Birth: 1975-08-11  PHYSICAL THERAPY DISCHARGE SUMMARY  Visits from Start of Care: 11  Current functional level related to goals / functional outcomes: See above   Remaining deficits: See above    Education / Equipment: HEP Plan: Patient agrees to discharge.  Patient goals were partially met. Patient is being discharged due to being pleased with the current functional level.  ?????        Rayetta Humphrey, [place] CLT [phone]

## 2017-05-25 ENCOUNTER — Encounter (HOSPITAL_COMMUNITY): Payer: Self-pay | Admitting: Physical Therapy

## 2017-05-27 ENCOUNTER — Encounter (HOSPITAL_COMMUNITY): Payer: Self-pay | Admitting: Physical Therapy

## 2017-05-30 ENCOUNTER — Encounter (HOSPITAL_COMMUNITY): Payer: Self-pay | Admitting: Physical Therapy

## 2017-06-01 ENCOUNTER — Encounter (HOSPITAL_COMMUNITY): Payer: Self-pay | Admitting: Physical Therapy

## 2017-06-03 ENCOUNTER — Encounter (HOSPITAL_COMMUNITY): Payer: Self-pay | Admitting: Physical Therapy

## 2017-06-28 ENCOUNTER — Encounter: Payer: Self-pay | Admitting: Physician Assistant

## 2017-06-28 ENCOUNTER — Ambulatory Visit: Payer: Self-pay | Admitting: Physician Assistant

## 2017-06-28 VITALS — BP 96/64 | HR 95 | Ht 63.0 in | Wt 132.0 lb

## 2017-06-28 DIAGNOSIS — F1911 Other psychoactive substance abuse, in remission: Secondary | ICD-10-CM

## 2017-06-28 DIAGNOSIS — F17210 Nicotine dependence, cigarettes, uncomplicated: Secondary | ICD-10-CM

## 2017-06-28 DIAGNOSIS — K219 Gastro-esophageal reflux disease without esophagitis: Secondary | ICD-10-CM

## 2017-06-28 NOTE — Progress Notes (Signed)
BP 96/64 (BP Location: Right Arm, Patient Position: Sitting, Cuff Size: Normal)   Pulse 95   Ht 5\' 3"  (1.6 m)   Wt 132 lb (59.9 kg)   SpO2 96%   BMI 23.38 kg/m    Subjective:    Patient ID: Emma Hicks, female    DOB: Oct 14, 1975, 42 y.o.   MRN: [id number]  HPI: Emma Hicks is a 42 y.o. female presenting on 06/28/2017 for Gastroesophageal Reflux   HPI   Pt is still at [facility] house.  She no longer goes to [employer] but she is now going to [facility]  She finished PT.   She followed up with orthopedics since her last appt here.    Relevant past medical, surgical, family and social history reviewed and updated as indicated. Interim medical history since our last visit reviewed. Allergies and medications reviewed and updated.   Current Outpatient Medications:  .  celecoxib (CELEBREX) 100 MG capsule, Take 1 capsule (100 mg total) by mouth 2 (two) times daily as needed., Disp: 180 capsule, Rfl: 1 .  citalopram (CELEXA) 20 MG tablet, Take 20 mg by mouth daily., Disp: , Rfl:  .  omeprazole (PRILOSEC) 40 MG capsule, Take 1 capsule (40 mg total) by mouth daily., Disp: 90 capsule, Rfl: 1 .  traZODone (DESYREL) 100 MG tablet, Take 100 mg by mouth at bedtime., Disp: , Rfl:   Review of Systems  Constitutional: Negative for appetite change, chills, diaphoresis, fatigue, fever and unexpected weight change.  HENT: Negative for congestion, dental problem, drooling, ear pain, facial swelling, hearing loss, mouth sores, sneezing, sore throat, trouble swallowing and voice change.   Eyes: Positive for itching. Negative for pain, discharge, redness and visual disturbance.  Respiratory: Negative for cough, choking, shortness of breath and wheezing.   Cardiovascular: Negative for chest pain, palpitations and leg swelling.  Gastrointestinal: Negative for abdominal pain, blood in stool, constipation, diarrhea and vomiting.  Endocrine: Positive for cold intolerance. Negative for heat  intolerance and polydipsia.  Genitourinary: Negative for decreased urine volume, dysuria and hematuria.  Musculoskeletal: Positive for arthralgias and back pain. Negative for gait problem.  Skin: Negative for rash.  Allergic/Immunologic: Positive for environmental allergies.  Neurological: Negative for seizures, syncope, light-headedness and headaches.  Hematological: Negative for adenopathy.  Psychiatric/Behavioral: Negative for agitation, dysphoric mood and suicidal ideas. The patient is nervous/anxious.     Per HPI unless specifically indicated above     Objective:    BP 96/64 (BP Location: Right Arm, Patient Position: Sitting, Cuff Size: Normal)   Pulse 95   Ht 5\' 3"  (1.6 m)   Wt 132 lb (59.9 kg)   SpO2 96%   BMI 23.38 kg/m   Wt Readings from Last 3 Encounters:  06/28/17 132 lb (59.9 kg)  04/13/17 127 lb (57.6 kg)  03/28/17 127 lb (57.6 kg)    Physical Exam  Constitutional: She is oriented to person, place, and time. She appears well-developed and well-nourished.  HENT:  Head: Normocephalic and atraumatic.  Neck: Neck supple.  Cardiovascular: Normal rate and regular rhythm.  Pulmonary/Chest: Effort normal and breath sounds normal.  Abdominal: Soft. Bowel sounds are normal. She exhibits no mass. There is no hepatosplenomegaly. There is no tenderness.  Musculoskeletal: She exhibits no edema.  Lymphadenopathy:    She has no cervical adenopathy.  Neurological: She is alert and oriented to person, place, and time.  Skin: Skin is warm and dry.  Psychiatric: She has a normal mood and affect. Her behavior is normal.  Vitals reviewed.       Assessment & Plan:     Encounter Diagnoses  Name Primary?  . Gastroesophageal reflux disease, esophagitis presence not specified Yes  . Substance abuse in remission (HCC)   . Cigarette nicotine dependence without complication     -no changes today -pt to follow up in 4 months.  RTO sooner prn

## 2017-07-27 ENCOUNTER — Other Ambulatory Visit: Payer: Self-pay | Admitting: Physician Assistant

## 2017-07-27 MED ORDER — OMEPRAZOLE 40 MG PO CPDR: 40.0000 mg | DELAYED_RELEASE_CAPSULE | Freq: Every day | ORAL | 6 refills | Status: AC

## 2017-08-03 ENCOUNTER — Other Ambulatory Visit: Payer: Self-pay | Admitting: Physician Assistant

## 2017-08-03 MED ORDER — CELECOXIB 100 MG PO CAPS: 100.0000 mg | ORAL_CAPSULE | Freq: Two times a day (BID) | ORAL | 1 refills | Status: AC | PRN

## 2017-10-25 ENCOUNTER — Ambulatory Visit: Payer: Self-pay | Admitting: Physician Assistant

## 2017-11-14 ENCOUNTER — Encounter: Payer: Self-pay | Admitting: Physician Assistant

## 2018-06-14 IMAGING — DX DG HIP (WITH OR WITHOUT PELVIS) 2-3V*L*
3 series · 3 of 3 positions shown · non-contrast
Comparison: None.

CLINICAL DATA: Left hip pain for the past 2 months. No known
injury.

EXAM:
DG HIP (WITH OR WITHOUT PELVIS) 2-3V LEFT

[pelvis ap]
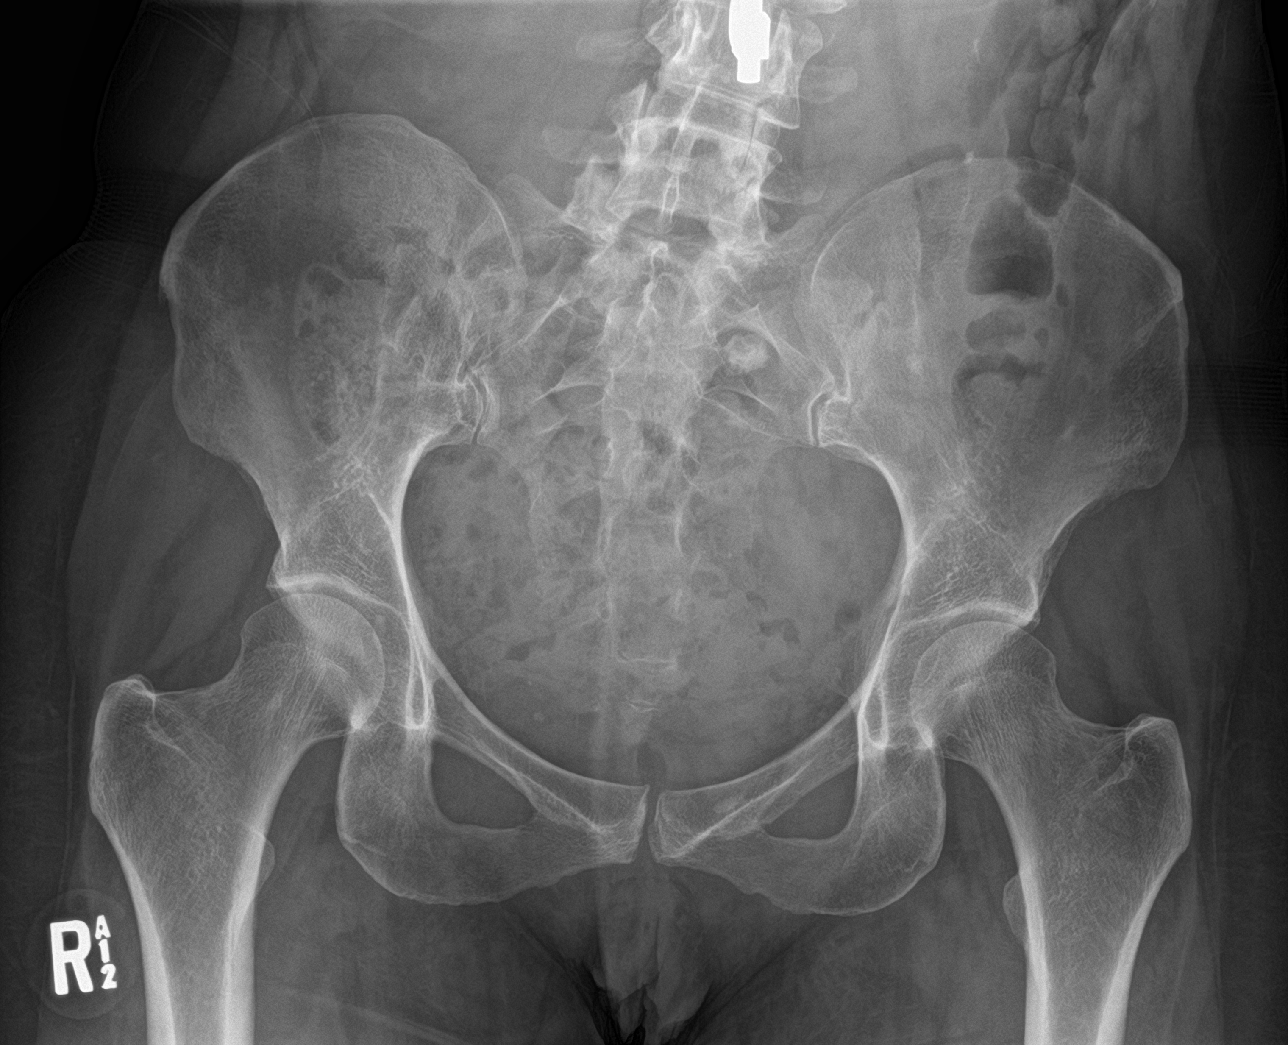

[hip ap]
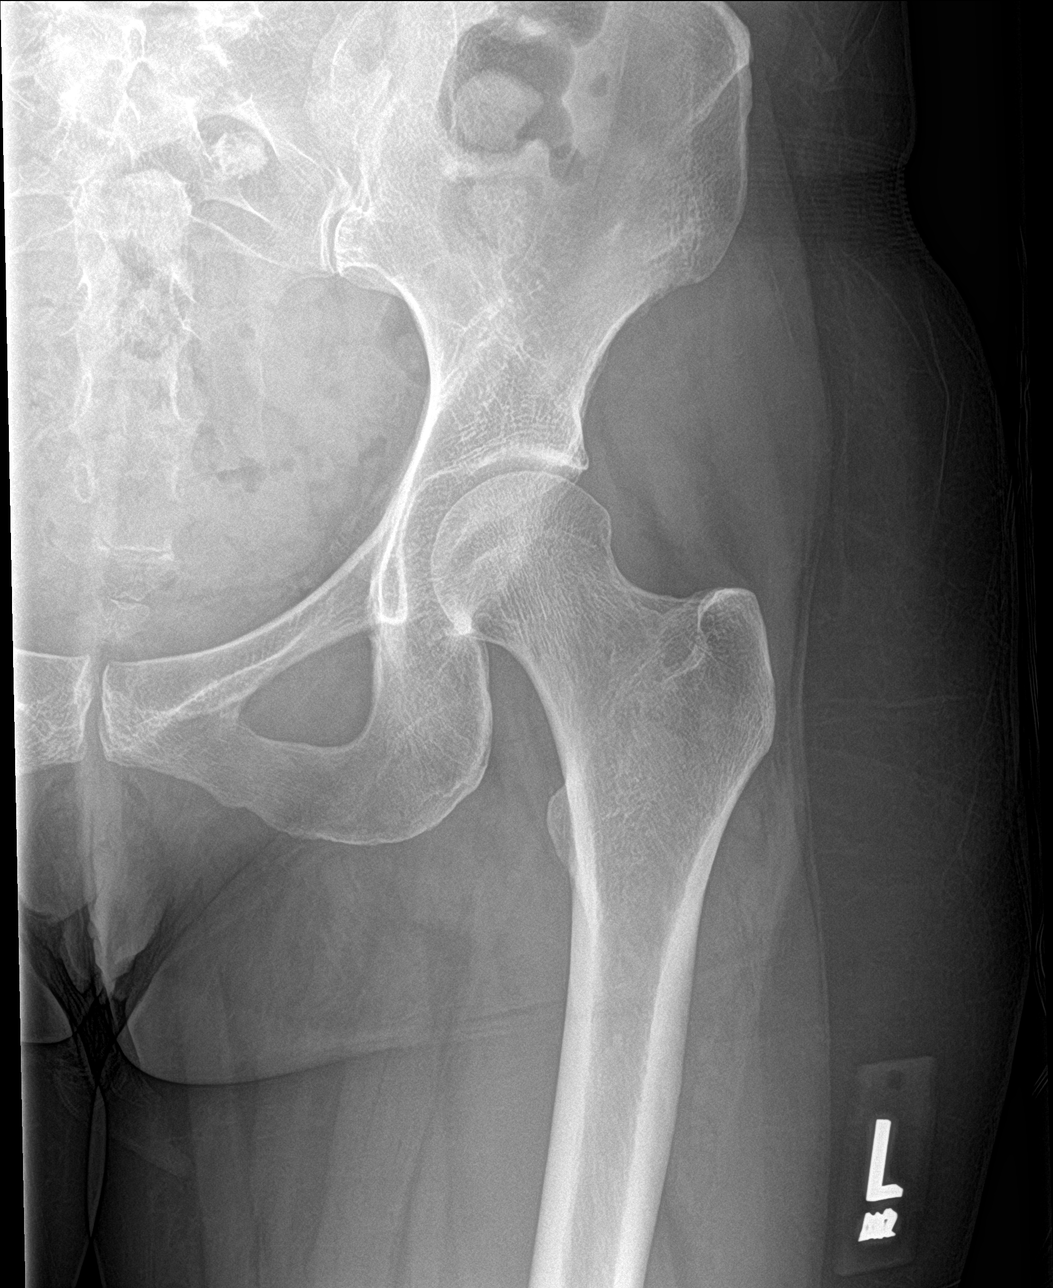

[hip lat]
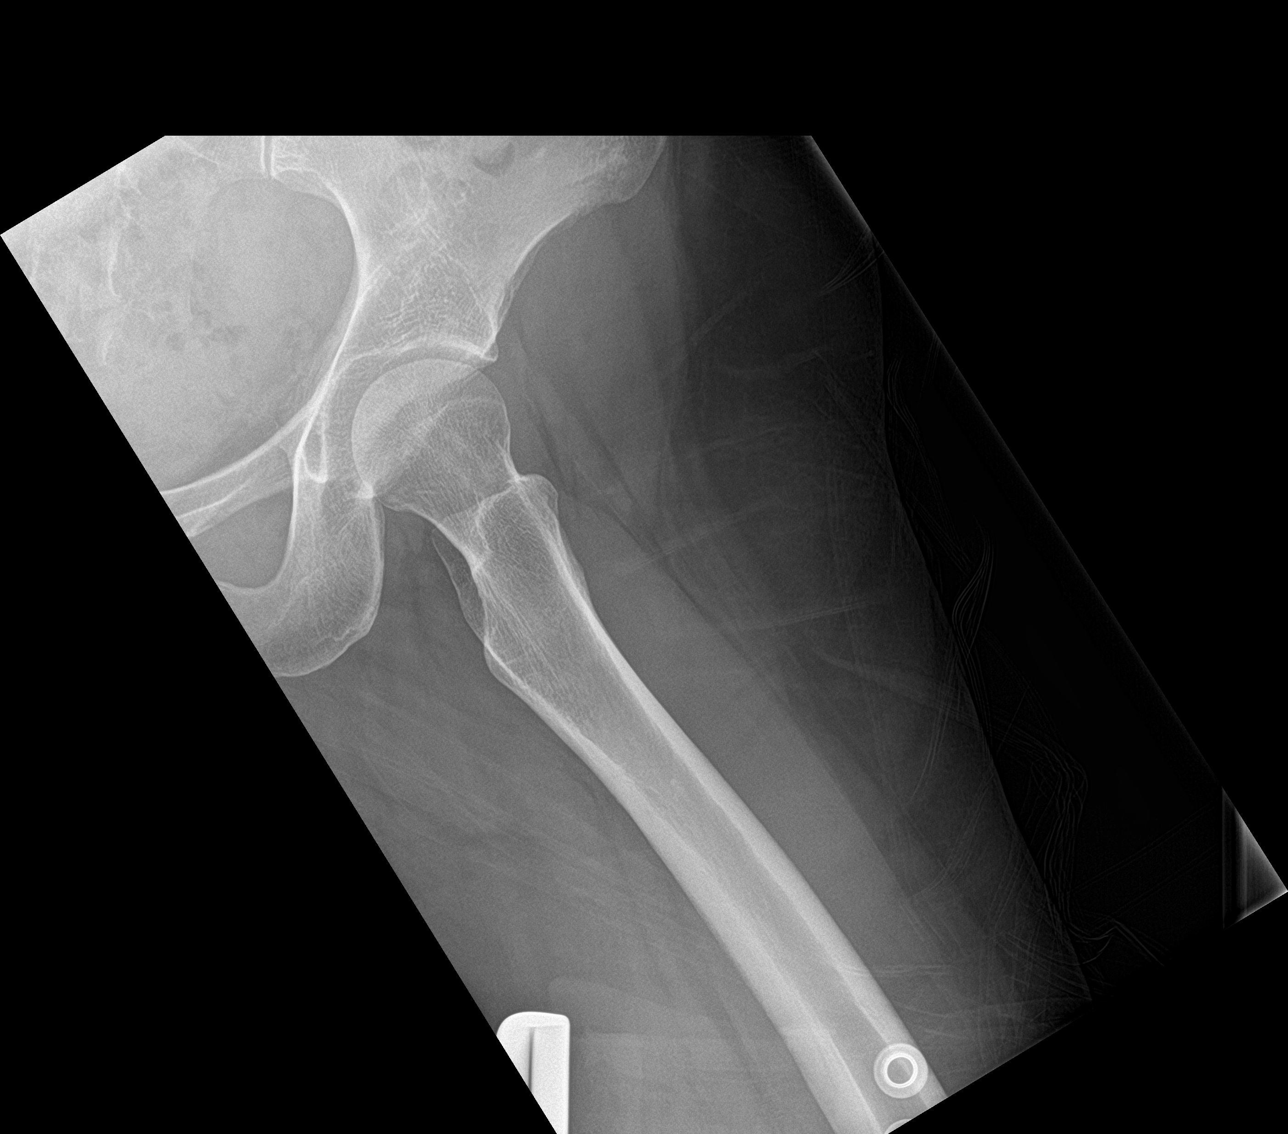

[3 of 3 positions shown; findings below may reference images not displayed]

FINDINGS: No fracture or dislocation. Left hip joint spaces appear preserved.
No evidence of avascular necrosis.

Limited visualization of the pelvis and contralateral right hip is
normal. Suspected bone island within the left sacral ala,
incompletely evaluated.

The caudal aspect of suspected paraspinal fusion hardware is
incompletely evaluated.

Punctate phleboliths overlie the lower pelvis bilaterally. Regional
soft tissues appear normal.
IMPRESSION: No definite explanation for patient's left hip pain.
